# Patient Record
Sex: Male | Born: 1965 | Race: White | Hispanic: No | Smoking: Never smoker
Health system: Southern US, Community
[De-identification: ages and names within clinical notes are randomized; demographics above are authoritative.]

## PROBLEM LIST (undated history)

## (undated) DIAGNOSIS — I10 Essential (primary) hypertension: Secondary | ICD-10-CM

## (undated) DIAGNOSIS — F329 Major depressive disorder, single episode, unspecified: Secondary | ICD-10-CM

## (undated) DIAGNOSIS — L0390 Cellulitis, unspecified: Secondary | ICD-10-CM

## (undated) DIAGNOSIS — F32A Depression, unspecified: Secondary | ICD-10-CM

## (undated) DIAGNOSIS — G4730 Sleep apnea, unspecified: Secondary | ICD-10-CM

## (undated) DIAGNOSIS — R7303 Prediabetes: Secondary | ICD-10-CM

## (undated) DIAGNOSIS — M1990 Unspecified osteoarthritis, unspecified site: Secondary | ICD-10-CM

## (undated) HISTORY — PX: KNEE SURGERY: SHX244

## (undated) HISTORY — DX: Morbid (severe) obesity due to excess calories: E66.01

## (undated) HISTORY — DX: Essential (primary) hypertension: I10

## (undated) HISTORY — DX: Cellulitis, unspecified: L03.90

## (undated) HISTORY — DX: Unspecified osteoarthritis, unspecified site: M19.90

---

## 1997-11-05 ENCOUNTER — Encounter: Admission: RE | Admit: 1997-11-05 | Discharge: 1998-02-03 | Payer: Self-pay | Admitting: Internal Medicine

## 1997-12-04 ENCOUNTER — Ambulatory Visit (HOSPITAL_BASED_OUTPATIENT_CLINIC_OR_DEPARTMENT_OTHER): Admission: RE | Admit: 1997-12-04 | Discharge: 1997-12-04 | Payer: Self-pay | Admitting: Orthopedic Surgery

## 1999-02-09 ENCOUNTER — Ambulatory Visit (HOSPITAL_BASED_OUTPATIENT_CLINIC_OR_DEPARTMENT_OTHER): Admission: RE | Admit: 1999-02-09 | Discharge: 1999-02-09 | Payer: Self-pay | Admitting: Orthopedic Surgery

## 2000-01-08 ENCOUNTER — Emergency Department (HOSPITAL_COMMUNITY): Admission: EM | Admit: 2000-01-08 | Discharge: 2000-01-08 | Payer: Self-pay | Admitting: Emergency Medicine

## 2000-01-08 ENCOUNTER — Encounter: Payer: Self-pay | Admitting: Emergency Medicine

## 2002-10-22 ENCOUNTER — Emergency Department (HOSPITAL_COMMUNITY): Admission: EM | Admit: 2002-10-22 | Discharge: 2002-10-22 | Payer: Self-pay | Admitting: Emergency Medicine

## 2002-10-22 ENCOUNTER — Encounter: Payer: Self-pay | Admitting: Emergency Medicine

## 2003-01-22 ENCOUNTER — Encounter: Payer: Self-pay | Admitting: Orthopedic Surgery

## 2003-01-26 ENCOUNTER — Ambulatory Visit (HOSPITAL_COMMUNITY): Admission: RE | Admit: 2003-01-26 | Discharge: 2003-01-26 | Payer: Self-pay | Admitting: Orthopedic Surgery

## 2006-12-31 ENCOUNTER — Inpatient Hospital Stay (HOSPITAL_COMMUNITY): Admission: EM | Admit: 2006-12-31 | Discharge: 2007-01-03 | Payer: Self-pay | Admitting: Emergency Medicine

## 2007-01-01 ENCOUNTER — Encounter (INDEPENDENT_AMBULATORY_CARE_PROVIDER_SITE_OTHER): Payer: Self-pay | Admitting: Internal Medicine

## 2010-10-18 NOTE — H&P (Signed)
NAMEWILDER, KUROWSKI                 ACCOUNT NO.:  [id number]   MEDICAL RECORD NO.:  [id number]          PATIENT TYPE:  EMS   LOCATION:  ED                           FACILITY:  [facility]   PHYSICIAN:  Michaelyn Barter, M.D. DATE OF BIRTH:  06-10-1965   DATE OF ADMISSION:  12/30/2006  DATE OF DISCHARGE:                              HISTORY & PHYSICAL   PRIMARY CARE PHYSICIAN:  Unassigned.   CHIEF COMPLAINT:  Weakness and visual changes.   HISTORY OF PRESENT ILLNESS:  [name] is a 45 year old gentleman with  a past medical history of hypertension.  He states that for  approximately four weeks he has been taking Topamax to lose weight.  He  suffers from morbid obesity.  He indicates that approximately a week and  a half ago he developed a kidney infection.  These symptoms at that time  consisted of bilateral flank pain as well as pain on urination.  He was  seen by his primary care doctor and was started on ciprofloxacin.  His  back pain and dysuria have decreased.  However, over the last few days  he has developed a white-out type of sensation, during which time he  indicates that whenever he looks at sunlight or bright light or anything  that appears to be white in color or chrome, it disappears.  He  complains of fatigue and difficulty thinking straight.  He also stated  that his energy level has decreased.  Over the past four days, his  eating habits have decreased significantly and indicates that he stopped  eating food approximately four days ago and his p.o. intake for liquids  has also decreased to less than 1 L of fluid a day.  He stopped taking  his Topamax Thursday morning which is four days ago.  He also went on to  state that he has been taking a lot of ibuprofen every day for five  days.  Specifically he states that he takes at least four tablets of  ibuprofen a day.  The last time he took the ibuprofen was two weeks ago,  however.  He denies having any nausea, vomiting,  fevers or chills.  He  does indicate that he has been sleeping for longer periods than usual.  He complains of some upper abdominal discomfort that has been going on  for approximately one week and it feels like a muscle ache and it is  associated with certain positions.   PAST MEDICAL HISTORY:  1. UTI.  The patient indicates that he has had multiple urinary tract      infections in the past  2. Morbid obesity.  3. Hypertension.   PAST SURGICAL HISTORY:  Bilateral knee surgery.   ALLERGIES:  PERCOCET caused the patient's skin to crawl.   HOME MEDICATIONS:  1. Ciprofloxacin 500 mg p.o. b.i.d.  2. Atenolol 100 mg daily.  3. Lisinopril 20 mg daily.  4. Triamterine/hydrochlorothiazide one tablet daily.   SOCIAL HISTORY:  The patient works as an Retail banker.  Cigarettes:  Never.  Alcohol:  Occasional.  Cocaine:  Never.   FAMILY  HISTORY:  Mother has history of diabetes mellitus.  She died from  complications of peritoneal dialysis.  She suffered from renal failure  secondary to her diabetes.  Father heart defect at birth.  He also had a  history of rheumatic fever.   LABORATORY DATA:  The patient's bilirubin total was 0.8, bilirubin  direct less than 0.1, alk phos 45, SGOT 18, SGPT 23, total protein 526,  albumin 3.7.  Sodium 136, potassium 5.6, chloride 107, CO2 16, glucose  114, BUN 111, creatinine 6.07.  Calcium 3.5.  White blood cell count  7.9, hemoglobin 12.2, hematocrit 36.7, platelet count 492.   ASSESSMENT/PLAN:  1. Acute renal failure.  The etiology of this is questionable.  There      may be multiple contributing factors including the fact that the      patient is currently an ACE inhibitor, has been using ibuprofen      chronically.  Also has a decreased p.o. intake for both liquids and      fluids.  He also states that he has been on Topamax.  The      relationship of the patient's Topamax to his acute renal failure,      however, is questionable. Will hold the  patient's ACE inhibitors.      Will not provide any ibuprofen.  Will provide him with IV fluid      hydration for now and will also order a renal ultrasound.  In      addition, we will check a 24-hour urine creatinine level and      consider consultation with nephrology.  2. Hyperkalemia.  Will provide the patient with Kayexalate for now.  3. Metabolic acidosis.  This is most likely secondary to the patient's      renal failure.  Will consider adding bicarb.  4. History of urinary tract infection.  Will check a urinalysis.  5. Hypertension.  The patient's blood pressure currently is slightly      hypotensive.  This may be secondary to dehydration.  Will hold the      patient's antihypertensive medications for now, provide gentle IV      fluid hydration and monitor.  6. Morbid obesity.  Will monitor this.  7. History of obstructive sleep apnea.  Will provide CPAP machine.  8. Gastrointestinal prophylaxis.  Will provide Protonix.  9. Deep venous thrombosis prophylaxis.  Will provide Lovenox.      Michaelyn Barter, M.D.  Electronically Signed     OR/MEDQ  D:  12/31/2006  T:  12/31/2006  Job:  [id number]

## 2010-10-18 NOTE — Discharge Summary (Signed)
NAMEORVIS, Jason Berry                 ACCOUNT NO.:  [id number]   MEDICAL RECORD NO.:  [id number]          PATIENT TYPE:  INP   LOCATION:  1423                         [facility]ACILITY:  [facility]   PHYSICIAN:  Herbie Saxon, MDDATE OF BIRTH:  December 19, 1965   DATE OF ADMISSION:  12/30/2006  DATE OF DISCHARGE:  01/03/2007                               DISCHARGE SUMMARY   DISCHARGE DIAGNOSES:  1. Acute renal failure.  2. Morbid obesity.  3. Hypertension.  4. Urinary tract infection.  5. History of obstructive sleep apnea and uses CPAP at night.  6. Degenerative joint disease of knees.   HOSPITAL COURSE:  This 45 year old Caucasian male presented to the  emergency room complaining of weakness and reduced visual acuity.  The  patient had been having poor oral intake of food and liquids in the last  3-4 days, and he also gives a history of ibuprofen use.  On  presentation, his BUN was 111, creatinine was 6.0.  He had hyperkalemia.  The patient's __________  Topamax was stopped.  It was advised also to  avoid NSAIDs.  A renal consult was called with Dr. Terrial Rhodes.  His metabolic acidosis and hyperkalemia were conservatively treated with  IV fluids and sodium carbonate.  This has improved.  The radiology  ultrasound did show a large renal stone, but this was non-obstructive.  Renal failure has been gradually improving, and creatinine has been  trending down nicely.  The patient has no complaints.  He has been  started on statins for hyperlipidemia.   DISCHARGE CONDITION:  Stable.   DIET:  Low sodium, heart healthy, renal, 2 gm sodium with low  cholesterol.   ACTIVITY:  As tolerated.   FOLLOWUP:  He is follow up with his primary care physician, Dr.  __________  [facility] in a week who will arrange endocrinology,  urology and renal follow up.  He is to have his basic metabolic panel  repeated weekly at least for the next 2 weeks to monitor the creatinine,  and also to have LFT's  checked in about 4 weeks to monitor for hepatic  function, as he is on the statins.   MEDICATIONS ON DISCHARGE:  1. Atenolol 100 mg daily.  2. Lisinopril 10 mg daily.  3. Zocor 20 mg q.h.s.  4. HCTZ 12.5 mg daily.  5. Vicodin 5/500 q.6h. p.r.n.   RADIOLOGY:  Renal ultrasound from December 31, 2006 shows a 1.3 cm left  renal stone.  No evidence of hydronephrosis.   PHYSICAL EXAMINATION:  GENERAL:  On examination today, he is a young  man, not in acute distress, morbidly obese.  VITAL SIGNS:  Temperature is 98, pulse is 80, respiratory rate 20, blood  pressure 120/70.  HEENT:  Pupils equal, round and reactive to light and accommodation.  NECK:  Supple.  SKIN:  He is pale clinically.  Not jaundiced.  CHEST:  Clinically clear.  Sounds 1 and 2 only.  No murmurs.  ABDOMEN:  Benign.  NEUROLOGIC:  Alert and oriented x3.  EXTREMITIES:  Peripheral pulses.  No pedal edema.   LABORATORY DATA:  Lipid profile of December 31, 2006 shows total cholesterol  of 236, triglycerides 282, LDL 165, HDL of 15.  WBCs of 7, hematocrit  34, platelet count 417.  Sodium is 139, potassium 4.3, chloride 108,  bicarbonate 23, BUN 59, creatinine 2.0, glucose 98.   __________  to monitor the use of ACE inhibitor and this will be  discontinued if creatinine is not found to be trending up.  The patient  will continue his medication and discussed with the patient extensively.  He verbalizes understanding.   DISCHARGE TIME:  Greater than 30 minutes.      Herbie Saxon, MD  Electronically Signed     MIO/MEDQ  D:  01/03/2007  T:  01/03/2007  Job:  [id number]

## 2010-10-18 NOTE — Consult Note (Signed)
Jason Berry, Jason Berry                 ACCOUNT NO.:  [id number]   MEDICAL RECORD NO.:  [id number]          PATIENT TYPE:  INP   LOCATION:  1423                         FACILITY:  [facility]   PHYSICIAN:  Jason Berry, M.D.DATE OF BIRTH:  Jan 28, 1966   DATE OF CONSULTATION:  12/31/2006  DATE OF DISCHARGE:                                 CONSULTATION   RENAL CONSULTATION:   CONSULTING PHYSICIAN:  Jason Berry, M.D.   REASON FOR CONSULTATION:  Renal failure.   HISTORY OF PRESENT ILLNESS:  [name] is a 45 year old white male with  past medical history significant for morbid obesity, hypertension, and  obstructive sleep apnea, who presented to [facility]  with a 2-week history of malaise, fatigue, as well as some visual  disturbances in which he has seen a white-out due to bright light.  He  also reported a metallic taste in his mouth over the last 2 weeks.  He  gives a history of developing some bilateral flank pain as well as  dysuria 2 weeks ago and was treated with antibiotic Cipro for presumed  urinary tract infection right around the time his malaise began.  He  also reports that he has been taking nonsteroidals, about 800 mg of  ibuprofen daily every day for the last 5 years before he goes to work.  He also was started on Topamax approximately 1 month ago in order to  lose weight and has had a loss of appetite and has not been eating or  drinking much.  He also reports that he did develop some nausea and  vomiting about 2 weeks ago but resolved over the last week, but he was  still not able to eat or drink much because of a funny metallic taste in  his mouth.  Of note, he has been taking lisinopril for blood pressure is  well as Maxzide diuretic therapy.  Upon evaluation in the emergency  room, he was noted to have a BUN of 111, a creatinine of 6.07, and a  potassium of 5.6.  We were asked to further evaluate his hyperkalemic  renal failure.   ALLERGIES:   To PERCOCET, which causes skin crawling.   PAST MEDICAL HISTORY:  1. Hypertension.  2. Morbid obesity.  3. Obstructive sleep apnea.  4. History of urinary tract infections.  5. Degenerative joint disease of his knees, status post bilateral knee      surgeries.   HOME MEDICATIONS:  1. Cipro 500 mg b.i.d.  2. Atenolol 100 mg a day.  3. Lisinopril 20 mg a day.  4. Maxzide one a day   FAMILY HISTORY:  His father is alive in fair health.  He has rheumatic  heart disease.  His mother died from complications of end-stage renal  disease when she was on peritoneal dialysis, so he does have a history  of kidney disease.   SOCIAL HISTORY:  He is single.  He is an Retail banker.  Denies  tobacco.  Has occasional alcohol use.  No IV drug use.  Originally from  [place], [place].  REVIEW OF SYSTEMS:  As per HPI.  GENERAL:  He feels better today.  CARDIAC:  No chest pain, palpitations, orthopnea, PND.  PULMONARY:  No  shortness of breath, hemoptysis, productive cough.  GI:  No recurrence  of nausea, vomiting, hematochezia, melena, or bright red blood per  rectum.  GU:  No dysuria, pyuria, hematuria, urgency, frequency,  retention, but did note a decrease in his urine output prior to his  hospitalization.  RHEUMATOLOGIC:  He has chronic back pain as well as  knee pain.  No swollen, tender, red joints.  DERMATOLOGIC:  He denies  any rashes but has noted that he is sensitive to light UV light since  taking Topamax.  All other systems negative.   PHYSICAL EXAM:  This is a well-developed, obese man lying in bed in no  apparent distress.  Temperature 97.9, pulse 96, blood pressure 101/54, respiratory rate 28.  HEENT:  Normocephalic, atraumatic.  Pupils equally round and reactive to  light.  Extraocular movements intact.  Oropharynx without lesions.  NECK:  Supple.  No lymphadenopathy or bruits.  LUNGS:  Clear to auscultation and percussion bilaterally.  No rales or  rhonchi.  CARDIAC:   Regular rate and rhythm with faint heart sounds.  No  precordial rub appreciated.  ABDOMEN:  Obese, normoactive bowel sounds, soft, nontender.  EXTREMITIES:  He is without edema.   LABS:  His white blood cell count is 7.4, hemoglobin 12, platelets 417.  Sodium 136, potassium 5.4, chloride 108, CO2 18, BUN 105, creatinine  5.42, glucose 105, calcium 9.9.  Albumin 3.4.  Phosphorus 6.4.  Urinalysis showed trace blood, 30 protein, 0-2 white blood cells, 0-2  red blood cells.  Ultrasound showed no hydronephrosis or cortical  abnormality, but he does have a 1.3 cm stone in the left kidney.   ASSESSMENT/PLAN:  1. Renal failure.  This is presumably acute, although we did not have      any previous labs available for review at this time.  Differential      diagnosis includes acute renal failure secondary to Topamax; acute      interstitial nephritis from Cipro, although this is less likely      since he does not have fevers or rash; acute tubular necrosis due      to relative hypotension in the setting of ACE inhibitors and      nonsteroidals as well as nausea, vomiting and decreased p.o.      intake.  Also in the differential is rhabdomyolysis, obstruction,      and also an acute glomerulonephritis.  In the meantime, his BUN and      creatinine are already improving.  We will continue with IV fluids,      continue to hold his ACE inhibitors and nonsteroidals.  Agree with      stopping Cipro, Topamax and diuretics.  The ultrasound did not show      any evidence of obstruction but he does have a left renal stone.      In the meantime will check fractional excretion of sodium as well      as check for urine eosinophils and CPK levels to rule out      rhabdomyolysis.  We will continue to follow his urine output daily,      serum creatinine.  2. Hyperkalemia secondary to #1.  Continue to follow.  3. Metabolic acidosis secondary to renal failure.  Change his IV      fluids to include sodium  bicarbonate and follow his CO2.  4. Hypotension.  This is of unclear etiology.  Will check blood and      urine cultures and continue to hold his blood pressure medicines      and follow.  5. Nephrolithiasis.  This is a large stone.  Would consider urology to      evaluate as a possible source of his urinary      tract infection and possibly urosepsis and possible need for a      stent.  6. Obstructive sleep apnea.  Will continue his CPAP and follow.   Thank you for this consultation.  We will continue to follow along with  you.           ______________________________  Jason Berry, M.D.     JC/MEDQ  D:  12/31/2006  T:  01/01/2007  Job:  [id number]

## 2010-10-21 NOTE — Op Note (Signed)
NAME:  Jason Berry, Jason Berry                           ACCOUNT NO.:  [id number]   MEDICAL RECORD NO.:  [id number]                   PATIENT TYPE:  OIB   LOCATION:  NA                                   FACILITY:  [facility]   PHYSICIAN:  Feliberto Gottron. Turner Daniels, M.D.                DATE OF BIRTH:  1965/06/12   DATE OF PROCEDURE:  01/26/2003  DATE OF DISCHARGE:                                 OPERATIVE REPORT   PREOPERATIVE DIAGNOSIS:  Right knee medial meniscal tear.   POSTOPERATIVE DIAGNOSIS:  Right knee chondromalacia patella grade 3.   OPERATION PERFORMED:  Right knee arthroscopic debridement of chondromalacia,  apex of patella, grade 3.   SURGEON:  Feliberto Gottron. Turner Daniels, M.D.   ASSISTANTLaural Benes. Jannet Mantis.   ANESTHESIA:  Local with IV sedation.   ESTIMATED BLOOD LOSS:  Minimal.   FLUIDS REPLACED:  crystalloids.   DRAINS:  None.   TOURNIQUET TIME:  None.   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  MRI was  consistent with a medial meniscal tear and because of persistent pain,  catching and popping along the medial side of the knee he is taken for  arthroscopic evaluation and treatment of his right knee.  He had similar  problems with his left knee.  He has failed conservative treatment with anti-  inflammatory medicines, exercises and observation and is having a great deal  of difficulty even doing light work at this point.   DESCRIPTION OF PROCEDURE:  The patient was identified by arm band and take  to the operating room at [facility] main hospital where he was taken because of  his body habitus at 425 pounds and some airway difficulty and local  anesthesia was induced into the right knee.  Lateral post applied to the  table and right lower extremity prepped and draped in the usual sterile  fashion from the ankle to the midthigh.  Using a #11 blade, standard  inferomedial and inferolateral  peripatellar portals were then made allowing  introduction of the arthroscope through the inferolateral portal and a 3.5  great white sucker shaver through the inferomedial portal.  We immediately  identified grade 3 chondromalacia with flap tears of the patella over a 1 cm  squared area and this was debrided back to stable margins with a 3.5 Gator  sucker shaver.  The trochlea, medial and lateral compartments were actually  in excellent condition.  There was no meniscal tearing medially or  laterally.  The cruciate ligaments were intact.  The cartilage was pristine.  The gutters were cleared, medially and laterally.  We also explored the  posterior aspect of the compartments taking the scope medial and lateral to  the PCL.  The knee was then washed out with normal saline solution.  The  arthroscopic  instruments removed.  A dressing of Xeroform, 4 x 4 dressing  sponges, Webril and Ace wrap was applied.  The patient was awakened and  taken to the recovery room without difficulty.                                                Feliberto Gottron. Turner Daniels, M.D.    Ovid Curd  D:  01/26/2003  T:  01/26/2003  Job:  [id number]

## 2011-03-20 LAB — BASIC METABOLIC PANEL
BUN: 111 — ABNORMAL HIGH
CO2: 16 — ABNORMAL LOW
CO2: 25
Calcium: 10.3
Calcium: 10.5
Chloride: 107
Creatinine, Ser: 6.07 — ABNORMAL HIGH
GFR calc Af Amer: 13 — ABNORMAL LOW
GFR calc Af Amer: >60
GFR calc non Af Amer: 10 — ABNORMAL LOW
Glucose, Bld: 114 — ABNORMAL HIGH
Potassium: 5.6 — ABNORMAL HIGH
Sodium: 136
Sodium: 140

## 2011-03-20 LAB — DIFFERENTIAL
Eosinophils Relative: 1
Lymphocytes Relative: 25
Lymphs Abs: 2
Monocytes Absolute: 0.7
Monocytes Relative: 9
Neutro Abs: 5.1

## 2011-03-20 LAB — LIPID PANEL
Cholesterol: 236 — ABNORMAL HIGH
HDL: 15 — ABNORMAL LOW
Triglycerides: 282 — ABNORMAL HIGH

## 2011-03-20 LAB — CBC
HCT: 31.4 — ABNORMAL LOW
HCT: 36.7 — ABNORMAL LOW
Hemoglobin: 12.2 — ABNORMAL LOW
MCHC: 33.3
MCV: 86.2
MCV: 86.3
MCV: 86.8
Platelets: 492 — ABNORMAL HIGH
RBC: 3.61 — ABNORMAL LOW
RBC: 3.99 — ABNORMAL LOW
RBC: 4.25
RDW: 12.9
WBC: 6.6
WBC: 7.4
WBC: 7.9

## 2011-03-20 LAB — RENAL FUNCTION PANEL
BUN: 59 — ABNORMAL HIGH
BUN: 87 — ABNORMAL HIGH
CO2: 19
Calcium: 10
Chloride: 108
Chloride: 110
Glucose, Bld: 105 — ABNORMAL HIGH
Glucose, Bld: 91
Glucose, Bld: 98
Phosphorus: 3.9
Phosphorus: 6.4 — ABNORMAL HIGH
Potassium: 4.3
Potassium: 4.8
Potassium: 5.4 — ABNORMAL HIGH
Sodium: 136

## 2011-03-20 LAB — HEPATIC FUNCTION PANEL
ALT: 26
AST: 18
Alkaline Phosphatase: 45
Bilirubin, Direct: <0.1
Total Bilirubin: 0.8

## 2011-03-20 LAB — URINE CULTURE: Colony Count: 80000

## 2011-03-20 LAB — URINALYSIS, ROUTINE W REFLEX MICROSCOPIC
Bilirubin Urine: NEGATIVE
Ketones, ur: NEGATIVE
Protein, ur: 30 — AB
Specific Gravity, Urine: 1.015
Urobilinogen, UA: 0.2

## 2011-03-20 LAB — PROTIME-INR: Prothrombin Time: 13.5

## 2011-03-20 LAB — SODIUM, URINE, RANDOM: Sodium, Ur: 78

## 2011-03-20 LAB — URINE MICROSCOPIC-ADD ON

## 2011-03-20 LAB — HEMOGLOBIN A1C: Hgb A1c MFr Bld: 6

## 2011-03-20 LAB — B-NATRIURETIC PEPTIDE (CONVERTED LAB): Pro B Natriuretic peptide (BNP): <30

## 2011-03-22 ENCOUNTER — Ambulatory Visit (INDEPENDENT_AMBULATORY_CARE_PROVIDER_SITE_OTHER): Payer: PRIVATE HEALTH INSURANCE | Admitting: Surgery

## 2011-03-22 ENCOUNTER — Encounter (INDEPENDENT_AMBULATORY_CARE_PROVIDER_SITE_OTHER): Payer: Self-pay | Admitting: Surgery

## 2011-03-22 VITALS — BP 156/84 | HR 92 | Temp 97.2°F | Resp 24 | Ht 72.0 in | Wt >= 6400 oz

## 2011-03-22 DIAGNOSIS — E669 Obesity, unspecified: Secondary | ICD-10-CM

## 2011-03-22 NOTE — Progress Notes (Signed)
Minor Iden comes in with his father to discuss weight loss surgery. He's been to [facility] and has been very frustrated by things. His weight is 592 his BMI is 80. He is a broad chested man and I think his BMI might not fill the whole story. He is 6 feet tall. I told them that to get into a BMI the mid 50s which required lose down to the low 400 pounds. Initially the discouraged him and his father been explained to reason we need him to lose visceral weight and abdominal wall weight.    I want to work with him and I am going to get Okey Regal to set up a point with [facility] and with psychology. I will see him back in about 6 weeks to assess his progress. At some point we will decide whether he would be a candidate for a gastric bypass or a band.

## 2011-03-23 ENCOUNTER — Other Ambulatory Visit (INDEPENDENT_AMBULATORY_CARE_PROVIDER_SITE_OTHER): Payer: Self-pay | Admitting: General Surgery

## 2011-04-19 ENCOUNTER — Encounter: Payer: Self-pay | Admitting: *Deleted

## 2011-04-19 ENCOUNTER — Encounter: Payer: PRIVATE HEALTH INSURANCE | Attending: Surgery | Admitting: *Deleted

## 2011-04-19 DIAGNOSIS — Z713 Dietary counseling and surveillance: Secondary | ICD-10-CM | POA: Insufficient documentation

## 2011-04-19 NOTE — Progress Notes (Signed)
  Medical Nutrition Therapy:  Appt start time: 1500 end time:  1600.   Assessment:  Primary concerns today: weight management. Mr. Sakai hopes to have bariatric surgery with Dr. Daphine Deutscher upon losing weight down to around a mid-50's BMI. He notes that he has had extreme frustration with the bariatric surgery process and other surgery centers. He reports that he does like Dr. Daphine Deutscher and our team so far and seems very motivated and energetic to lose weight. He has lost ~10 lbs since seeing Dr. Daphine Deutscher last (BMI = 79). He stated that "diets do not work" for him and the only plan that gives and success is the [place]'s diet. Per food recall pt consuming excessive amounts of fat, calories, and protein. I do recommend a lower-carbohydrate diet yet we will need to modify portions sizes and change to leaner meats with lighter dressings/sauces. Mr. Corniel regularly skips meals or goes >7 hrs without eating which causes him to "binge" at the next meal. Will recommend smaller, more frequent meals and a Pre-Op Diet type meal plan which includes protein supplements. This patient is very friendly, energetic and seems like he will be compliant with nutrition recommendations.  MEDICATIONS: See medication list   DIETARY INTAKE:  Usual eating pattern includes 3 meals and 2 snacks per day.  24-hr recall:  B (AM): 3-4 eggs, 3-4 [place] patties  Snk (AM): olive (2), 1 oz cheese  L (PM): Hamburger patty (2 - 16 oz total meat), broccoli (w/ butter or alfredo sauce), pickle Snk (PM): N/A D (PM): Salad (3-5 cups), cucumbers, cheese (1/2 cup), chicken (8-16 oz), bleu cheese dressing (1/4 - 1/3 cup dressing) Snk ( PM): N/A Beverages: unsweetened tea, diet soda, water - 150 oz total  Estimated energy intake per food recall analysis: 3800 calories 100 g carbohydrates 200 g + protein 200 g + fat  Usual physical activity: Mountain biking (2-3 times/week), 25 minutes/week  Progress Towards Goal(s):  In progress.   Nutritional  Diagnosis:  NI-1.5 Excessive energy intake As related to large portions of food.  As evidenced by pt consuming >3000 kcals/day.    Intervention:  Nutrition education.  Handouts given during visit include:  Bariatric Surgery Pre-Op Diet  Protein Shake Handout  Monitoring/Evaluation:  Dietary intake, exercise, and body weight in 2 week(s).

## 2011-04-19 NOTE — Patient Instructions (Signed)
Follow:    Pre-Op Diet for weight loss before bariatric surgery  May consume 6-8 oz lean protein  Increase non-starchy vegetables  Only use light or fat-free dressings  Only use light margarine spreads  Avoid thick/creamy sauces on vegetables  Use protein supplements between meals  Continue drinking >64 oz fluid  Continue exercise on mountain bike as able  Follow-up at [facility] in 2 weeks for weight check.

## 2011-05-10 ENCOUNTER — Encounter: Payer: PRIVATE HEALTH INSURANCE | Attending: Surgery | Admitting: *Deleted

## 2011-05-10 ENCOUNTER — Encounter: Payer: Self-pay | Admitting: *Deleted

## 2011-05-10 DIAGNOSIS — Z713 Dietary counseling and surveillance: Secondary | ICD-10-CM | POA: Insufficient documentation

## 2011-05-10 NOTE — Progress Notes (Signed)
  Medical Nutrition Therapy: Appt start time: 1500 end time: 1600.   Assessment: Primary concerns today: weight management. Jason Berry hopes to have bariatric surgery with Dr. Daphine Deutscher upon losing weight down to around a mid-50's BMI. Today he weighs in 1.5lb lighter than last month. He feels that his lack of significant weight loss may be related to binge eating on Thanksgiving. He's here today to get back on track.  MEDICATIONS: See medication list   DIETARY INTAKE:  Usual eating pattern includes 3 meals and 2 snacks per day.  24-hr recall:  B (AM): 3 eggs, 2 [place] sausage patties, 4 oz cheese Snk (AM): Protein Shake L (PM): Chicken breast (10-14 oz total meat), broccoli (fresh) Snk (PM): 3 Protein Bars D (PM): Salad (3-5 cups), cucumbers, cheese (1/2 cup), chicken (6 oz), oil and vinegar dressing (1/4 - 1/3 cup dressing)  Snk ( PM): 3 Protein Bars *Or skips 1 meal, 2 snacks per day and overeats when he does eat  Beverages: unsweetened tea, diet soda, water - 150 oz total   Estimated energy intake per food recall analysis:  3000 calories  100 g carbohydrates  200 g + protein  200 g + fat   Usual physical activity: No physical activity reported over the past month  Progress Towards Goal(s): In progress.   Nutritional Diagnosis:  NI-1.5 Excessive energy intake As related to large portions of food. As evidenced by pt consuming >3000 kcals/day.   Intervention: Nutrition education.   Handouts given during visit include:  Bariatric Surgery Pre-Op Diet  Protein Shake Handout  Monitoring/Evaluation: Dietary intake, exercise, and body weight in 2 week(s).

## 2011-05-10 NOTE — Patient Instructions (Addendum)
Follow:  Restart Pre-Op Diet for weight loss before bariatric surgery  Avoid meal skipping; Eat 3-4 smaller meals/day May consume 6-8 oz lean protein (smaller portions)  Increase non-starchy vegetables  Only use light or fat-free dressings  Only use Pam cooking spray, olive oil, or canola oil for cooking  Use protein supplements between meals  Avoid Protein Bars due to high calories/binging on these foods Continue drinking >64 oz fluid  Continue being active as you can

## 2011-06-13 ENCOUNTER — Ambulatory Visit: Payer: Self-pay | Admitting: *Deleted

## 2011-06-26 ENCOUNTER — Encounter: Payer: Self-pay | Admitting: *Deleted

## 2011-06-26 ENCOUNTER — Encounter: Payer: PRIVATE HEALTH INSURANCE | Attending: Surgery | Admitting: *Deleted

## 2011-06-26 DIAGNOSIS — E663 Overweight: Secondary | ICD-10-CM | POA: Insufficient documentation

## 2011-06-26 DIAGNOSIS — Z713 Dietary counseling and surveillance: Secondary | ICD-10-CM | POA: Insufficient documentation

## 2011-06-26 NOTE — Progress Notes (Signed)
  Medical Nutrition Therapy: Appt start time: 1500 end time: 1600.   Assessment: Primary concerns today: weight management. Jason Berry arrives 20 minutes late for his 30 minute appointment at 12pm and is required to reschedule to 4pm later the same day. He was very upset by this and stormed out of the office. Upon follow up at 4pm he reports that he has been under a great deal of stress due to the holidays and problems with his father. He is up 8 lbs since his last visit which he contributes to poor food label reading. His portions remain considerably larger than his goal portions and feels if sticks to an "Atkin's type" diet he will lose the weight regardless of his portions. Reviewed portion sizes, food labels, sauces/gravies/oils in cooking and reiterated goals for weight loss.  MEDICATIONS: See medication list   DIETARY INTAKE:  Usual eating pattern includes 3 meals and 2 snacks per day.   24-hr recall:  B (AM): 3 eggs, 2 [place] sausage patties, 4 oz cheese  Snk (AM): Protein Shake  L (PM): Chicken breast (10-14 oz total meat), broccoli (fresh)  Snk (PM): 3 Protein Bars  D (PM): Salad (3-5 cups), cucumbers, cheese (1/2 cup), chicken (6 oz), oil and vinegar dressing (1/4 - 1/3 cup dressing)  Snk ( PM): 3 Protein Bars  *Or skips 1 meal, 2 snacks per day and overeats when he does eat   Beverages: unsweetened tea, diet soda, water - 150 oz total   Estimated energy intake per food recall analysis:  3000-3500 calories  300 g carbohydrates  200 g + protein  200 g + fat   Usual physical activity: No physical activity reported over the past month   Progress Towards Goal(s): In progress.   Nutritional Diagnosis:  [place]-1.5 Excessive energy intake As related to large portions of food. As evidenced by pt consuming >3000 kcals/day.   Intervention: Nutrition education.  Handouts given during visit include:  Bariatric Surgery Pre-Op Diet  Label reading  Monitoring/Evaluation: Dietary intake,  exercise, and body weight in 2 week(s).

## 2011-06-26 NOTE — Patient Instructions (Signed)
Follow:  Restart Pre-Op Diet for weight loss before bariatric surgery  Avoid meal skipping; Eat 3-4 smaller meals/day May consume 6-8 oz lean protein (smaller portions)  Increase non-starchy vegetables  Only use light or fat-free dressings  Only use Pam cooking spray, olive oil, or canola oil for cooking  Use protein supplements between meals  Avoid Protein Bars due to high calories/binging on these foods Continue drinking >64 oz fluid  Continue being active as you can 

## 2011-07-27 ENCOUNTER — Encounter: Payer: Self-pay | Admitting: *Deleted

## 2011-07-27 ENCOUNTER — Encounter: Payer: PRIVATE HEALTH INSURANCE | Attending: Surgery | Admitting: *Deleted

## 2011-07-27 DIAGNOSIS — Z713 Dietary counseling and surveillance: Secondary | ICD-10-CM | POA: Insufficient documentation

## 2011-07-27 NOTE — Patient Instructions (Signed)
Follow:  Restart Pre-Op Diet for weight loss before bariatric surgery  Slow down eating at meals. Avoid meal skipping; Eat 3-4 smaller meals/day  Consume 6-8 oz lean protein (smaller portions)  Try light or fat-free dressings - Try the spray dressings.  Only use Pam cooking spray, olive oil, or canola oil for cooking  Use protein supplements between meals.  Continue drinking >64 oz fluid.  Continue being active as you can.

## 2011-07-27 NOTE — Progress Notes (Signed)
  Medical Nutrition Therapy: Appt start time: 1500 end time: 1600.   Assessment: Primary concerns today: weight management; F/U.  Mr. Klem returns for F/U with a wt loss of 17.7 lbs. Continues to follow moderately strict Atkins Diet with very few CHO and highly excessive protein intake (~42% of calories). Advised pt to decrease protein intake at lunch and dinner to approximately half current portions. Excessive sodium intake also noted.  Reviewed portion sizes, hidden sodium, excessive pro intake, and advised pt not to keep CHO out of meals all together.  MEDICATIONS: See medication list.  DIETARY INTAKE:  Usual eating pattern includes 3 meals and 2 snacks per day.   24-hr recall: (~2300 cal, 240g pro, 119g fat) B (AM): 3 eggs, 2 [place] sausage patties, 4 oz cheese Snk (AM): few pcs of smoked ham L (PM): Chicken breast (10-16 oz total meat), broccoli (fresh) or grilled asparagus Snk (PM):  Pork rinds, cheese (2% - 1 pc)  D (PM): Chopped steak (16 oz), onions, mushrooms, grilled zucchini Snk ( PM):  SF vanilla jello w/ SF whipped cream Beverages: unsweetened tea, diet soda (rare), water - 150 oz total   Usual physical activity:  Walks new dog 3-4 times/day.   Progress Towards Goal(s):  In progress.   Nutritional Diagnosis:  NI-1.5 Excessive energy intake related to large portions of food as evidenced by pt consuming >2300 kcals/day while attempting weight loss.   Intervention: Nutrition education.   Monitoring/Evaluation: Dietary intake, exercise, and body weight in 4 week(s).

## 2011-07-28 ENCOUNTER — Encounter: Payer: Self-pay | Admitting: *Deleted

## 2011-08-24 ENCOUNTER — Encounter: Payer: PRIVATE HEALTH INSURANCE | Attending: Surgery | Admitting: *Deleted

## 2011-08-24 ENCOUNTER — Encounter: Payer: Self-pay | Admitting: *Deleted

## 2011-08-24 DIAGNOSIS — Z713 Dietary counseling and surveillance: Secondary | ICD-10-CM | POA: Insufficient documentation

## 2011-08-24 NOTE — Patient Instructions (Signed)
Continue previous goals:  Restart Pre-Op Diet for weight loss before bariatric surgery  Slow down eating at meals. Avoid meal skipping; Eat 3-4 smaller meals/day  Consume 6-8 oz lean protein (smaller portions)  Try light or fat-free dressings - Try the spray dressings.  Only use Pam cooking spray, olive oil, or canola oil for cooking  Use protein supplements between meals.  Continue drinking >64 oz fluid.  Continue being active as you can.

## 2011-08-24 NOTE — Progress Notes (Signed)
Medical Nutrition Therapy: Appt start time: 1100 end time: 1130.   Assessment: Primary concerns today: weight management; F/U.  Mr. Rossa returns for F/U with a wt loss of 13.6 lbs. Reports following the Atkins diet and total weight loss equals 31.3 lbs in last 2 months.  Advised pt to choose leaner protein options and reminded him not to avoid CHO at meals. Concerned about MD wanting to add Metformin. States his BGs are 116-120 mg and recent A1c at [facility] was "5-something". Will call office to verify.  MEDICATIONS: See medication list.  Reports MD at hospital in [place] wants to add Metformin. Pt fears this will make his pancreas dependent on the medication and give him full blown DM.   DIETARY INTAKE:  Usual eating pattern includes 3 meals and 2 snacks per day. Pt is following Atkins diet. Reports attempts to decrease high fat proteins (sausage, reg beef) and replace with lean protein sources (Atkins shakes, [place] sausage, ground [place]).    Usual physical activity:  Walks new dog 3-4 times/day.   Progress Towards Goal(s):  In progress.   Nutritional Diagnosis:  [place]-3.3 Morbid obesity related to excessive CHO and fat intake as evidenced by patient reported food history and sedentary lifestyle.  Intervention: Nutrition education.   Monitoring/Evaluation: Dietary intake, exercise, and body weight in 8 week(s).

## 2011-09-14 ENCOUNTER — Telehealth: Payer: Self-pay | Admitting: *Deleted

## 2011-09-17 NOTE — Telephone Encounter (Signed)
Returned pt call regarding whether he should take Metformin or not. Redirected to MD.

## 2011-10-17 ENCOUNTER — Ambulatory Visit: Payer: Self-pay | Admitting: *Deleted

## 2011-10-18 ENCOUNTER — Ambulatory Visit: Payer: Self-pay | Admitting: *Deleted

## 2011-10-19 ENCOUNTER — Ambulatory Visit: Payer: Self-pay | Admitting: *Deleted

## 2012-01-18 ENCOUNTER — Encounter: Payer: Self-pay | Admitting: *Deleted

## 2012-01-18 ENCOUNTER — Encounter: Payer: PRIVATE HEALTH INSURANCE | Attending: Surgery | Admitting: *Deleted

## 2012-01-18 DIAGNOSIS — Z01818 Encounter for other preprocedural examination: Secondary | ICD-10-CM | POA: Insufficient documentation

## 2012-01-18 DIAGNOSIS — Z713 Dietary counseling and surveillance: Secondary | ICD-10-CM | POA: Insufficient documentation

## 2012-01-18 NOTE — Patient Instructions (Addendum)
Continue previous goals:  Restart Pre-Op Diet for 1 week Start Modified Bariatric Surgery Post-Op diet for 1-2 weeks Adjust to include 30g CHO Avoid meal skipping; Eat 3-4 smaller meals/day  Consume 6-8 oz lean protein (smaller portions)  Continue being active as you can.

## 2012-01-18 NOTE — Progress Notes (Addendum)
Medical Nutrition Therapy: Appt start time: 1145 end time: 1220.   Primary concerns today:  Pre-Op Bariatric Surgery Weight management; F/U.  Jason Berry returns today for f/u and reports he is at a standstill with weight loss. Food recall shows he has continued to eat high fat meats, but has decreased his portions. He reports having more energy, that he can walk farther before tiring, and that his clothes are fitting much looser now. States his MD is considering a liquid diet at this point to jumpstart his weight loss. Discussed and created a plan of action.    MEDICATIONS: Reports he is now taking Metformin. Not taking allopurinol at this time. Plans to resume.  DIETARY INTAKE:  Usual eating pattern includes 3 meals and 2 snacks per day. B: 4 eggs w/ cheese, 2 pcs regular sausage, hot tea w/ splenda Snk: SF Jello L: 6-8 oz ground beef (85/15) w/ 1.5 pcs regular bacon Snk: Pork rinds D: 8 oz steak (top round), 2-3 oz sweet potato, salad  Usual physical activity:  Walks dog 3-4 times/day; active around garage fixing cars   Progress Towards Goal(s):  In progress.   Nutritional Diagnosis:  [place]-3.3 Morbid obesity related to excessive CHO and fat intake as evidenced by patient reported food history and sedentary lifestyle.  Intervention: Nutrition education.   Monitoring/Evaluation: Dietary intake, exercise, and body weight in 8 week(s).

## 2012-01-19 ENCOUNTER — Ambulatory Visit: Payer: Self-pay | Admitting: *Deleted

## 2012-02-20 ENCOUNTER — Encounter: Payer: Self-pay | Admitting: *Deleted

## 2012-02-20 ENCOUNTER — Encounter: Payer: PRIVATE HEALTH INSURANCE | Attending: Surgery | Admitting: *Deleted

## 2012-02-20 VITALS — Ht 72.0 in | Wt >= 6400 oz

## 2012-02-20 DIAGNOSIS — Z01818 Encounter for other preprocedural examination: Secondary | ICD-10-CM | POA: Insufficient documentation

## 2012-02-20 DIAGNOSIS — Z713 Dietary counseling and surveillance: Secondary | ICD-10-CM | POA: Insufficient documentation

## 2012-02-20 NOTE — Patient Instructions (Addendum)
   Continue previous goals  Increase exercise as able.

## 2012-02-20 NOTE — Progress Notes (Addendum)
Medical Nutrition Therapy: Appt start time: 1000 end time: 1100.   Primary concerns today: Nutrition Modifications for Bariatric Surgery Candidate; F/U.  Cindee Lame returns today with his significant other who had the gastric sleeve several years ago. She has been helping him at home and cooking for him. He reports great strides in changing his eating behavior and now finds himself full/pushing the plate away after much smaller portions. He has slowed his pace at meals and reports having even more energy than last visit. He is visibly smaller today.  He feels he is "on a roll" and plans to continue the specialized post-op diet, as he contributes his recent success to this plan.  BMI is now down from 80 to 73 kg/m^2.   MEDICATIONS: No changes reported.  Usual physical activity:  Active around garage fixing cars; increasing ADLs and noticing it is becoming easier to stand for longer periods of time.  Progress Towards Goal(s):  In progress.   Nutritional Diagnosis:  [place]-3.3 Morbid obesity related to excessive CHO and fat intake as evidenced by patient reported food history and sedentary lifestyle.  Intervention: Nutrition education.   Monitoring/Evaluation: Dietary intake, exercise, and body weight in 4 week(s).

## 2012-03-19 ENCOUNTER — Encounter: Payer: PRIVATE HEALTH INSURANCE | Attending: Surgery | Admitting: *Deleted

## 2012-03-19 ENCOUNTER — Encounter: Payer: Self-pay | Admitting: *Deleted

## 2012-03-19 VITALS — Ht 72.0 in | Wt >= 6400 oz

## 2012-03-19 DIAGNOSIS — Z01818 Encounter for other preprocedural examination: Secondary | ICD-10-CM | POA: Insufficient documentation

## 2012-03-19 DIAGNOSIS — Z713 Dietary counseling and surveillance: Secondary | ICD-10-CM | POA: Insufficient documentation

## 2012-03-19 NOTE — Progress Notes (Addendum)
Medical Nutrition Therapy: Appt start time: 1030  End time: 1115.   Primary concerns today: Nutrition Modifications for Bariatric Surgery Candidate; F/U.  Cindee Lame comes in today discouraged with lack of weight loss. Reports extreme stress recently with family issues and increased emotional eating after last MD visit showed no wt loss. Has not been taking meds for last 3 days; some weight gain likely d/t fluid retention. Wants to try "Atkins Cheat Night" diet, though told him to hold off for now. Discovered he has been consuming a 14 oz aerosol can of heavy whipping cream a day, which has 1590 cal and 159 g of fat (80 g as sat fat).  States he has been doing this for a long time. This is likely the culprit in his lack of significant weight loss. Will see him back in 4 weeks to determine.  MEDICATIONS: Hasn't taken any meds (except Zoloft) in last 3 days d/t stress and forgetting to take them.   Usual physical activity:  Decreased recently.  Progress Towards Goal(s):  In progress.   Nutritional Diagnosis:  [place]-3.3 Morbid obesity related to excessive calorie and fat intake as evidenced by patient reported food history and sedentary lifestyle.  Intervention: Nutrition education.   Goals:   Stop the whipping cream and replace with greek yogurt.   Go back to old eating pattern  Increase exercise as able   Monitoring/Evaluation: Dietary intake, exercise, and body weight in 4 week(s).

## 2012-03-19 NOTE — Patient Instructions (Addendum)
Goals:   Stop the whipping cream and replace with greek yogurt.   Go back to old eating pattern  Increase exercise as able

## 2012-04-19 ENCOUNTER — Encounter: Payer: Self-pay | Admitting: *Deleted

## 2012-04-19 ENCOUNTER — Encounter: Payer: PRIVATE HEALTH INSURANCE | Attending: Surgery | Admitting: *Deleted

## 2012-04-19 VITALS — Ht 71.5 in | Wt >= 6400 oz

## 2012-04-19 DIAGNOSIS — Z01818 Encounter for other preprocedural examination: Secondary | ICD-10-CM | POA: Insufficient documentation

## 2012-04-19 DIAGNOSIS — Z713 Dietary counseling and surveillance: Secondary | ICD-10-CM | POA: Insufficient documentation

## 2012-04-19 NOTE — Progress Notes (Signed)
Medical Nutrition Therapy: Appt start time: 1030  End time: 1115.   Primary concerns today: Nutrition Modifications for Bariatric Surgery Candidate; F/U.  Jason Berry returns today for monthly f/u and his discouragement continues over lack of significant wt loss.  Reports he has not had ANY whipping cream since last visit, though has only lost 5 lbs since last visit. Consumes large portions of lean protein and non-starchy vegetables. Has been doing his "Atkins Cheat Nights" from 6p-12a 1 night a week; recall shows highly excessive portions of CHO during this time. Still not taking any meds and reports recent shoulder injury, which he was seen in the ED for. Has caused decrease in the little exercise he was getting. Has not seen Dr. Daphine Deutscher in over a year. Recommend f/u with him.  TANITA  BODY COMP RESULTS  04/19/12   Weight (lbs) 549.0   BMI (kg/m^2) 75.5   Fat Mass (lbs) 326.0   Fat Free Mass (lbs) 223.0   Total Body Water (lbs) 163.0   MEDICATIONS: Hasn't taken any meds in last few weeks; states he ran out of them.   Recent Dietary Recall:  B: 10 oz Whey protein shake w/ almond milk OR 2 eggs w/ egg whites, 2% cheese and 2 pcs [place] sausage S: 2-3 oz pork rinds (sometimes w/ yogurt dip) L: 10 oz protein shake OR 2 hot dogs w/ buns, 6-8 oz green beans S: Cabbage, Atkins bar, OR pork rinds  D: Grilled chicken breast (8-10 oz), broccoli or cabbage S: SF jello Beverages: decaf crystal light tea, water  Atkins Cheat Nights include chinese food (1/2 of portion he used to get), 1 quart of lasagna, pasta/rice. Sometimes is extremely hungry all day, then others he is fine with the recall portions.  Usual physical activity:  None d/t recent shoulder injury.   Progress Towards Goal(s):  In progress.   Nutritional Diagnosis:  [place]-3.3 Morbid obesity related to excessive calorie and fat intake as evidenced by patient reported food history and sedentary lifestyle.  Intervention: Nutrition education.    Goals:   Discontinue Atkins Cheat Nights  Increase exercise as able - recommend f/u with MD regarding shoulder injury.  Monitoring/Evaluation: Dietary intake, exercise, and body weight in 4 week(s).

## 2012-05-17 ENCOUNTER — Ambulatory Visit: Payer: Self-pay | Admitting: *Deleted

## 2012-05-21 ENCOUNTER — Encounter: Payer: Self-pay | Admitting: *Deleted

## 2012-05-21 ENCOUNTER — Encounter: Payer: PRIVATE HEALTH INSURANCE | Attending: Surgery | Admitting: *Deleted

## 2012-05-21 VITALS — Ht 71.5 in | Wt >= 6400 oz

## 2012-05-21 DIAGNOSIS — Z713 Dietary counseling and surveillance: Secondary | ICD-10-CM | POA: Insufficient documentation

## 2012-05-21 DIAGNOSIS — Z01818 Encounter for other preprocedural examination: Secondary | ICD-10-CM | POA: Insufficient documentation

## 2012-05-21 NOTE — Progress Notes (Signed)
Medical Nutrition Therapy: Appt start time: 1030  End time: 1130.   Primary concerns today: Nutrition Modifications for Bariatric Surgery Candidate; F/U.  Jason Berry returns for f/u with a 5 lb weight loss from last visit (and 7 lbs of FAT MASS!!), even after overeating at Thanksgiving.  Reports he is no longer eating butter, pork rinds, or doing the Atkins "Cheat Nights".  Continues smaller, healthier portions with help from Myrene Buddy, his live-in girlfriend who had bariatric surgery several years ago. Great support for him!!  He is very encouraged by the [place] data and finally "gets it" that he cannot go strictly by the scale.  Plans to start riding his bike for increased exercise.   TANITA  BODY COMP RESULTS  04/19/12 05/21/12   Weight (lbs) 549.0 544.0   BMI (kg/m^2) 75.5 74.8   Fat Mass (lbs) 326.0 319.0   Fat Free Mass (lbs) 223.0 225.0   Total Body Water (lbs) 163.0 164.5   MEDICATIONS:  Taking as directed at this time    Usual physical activity:  None d/t recent shoulder injury.   Progress Towards Goal(s):  In progress.   Nutritional Diagnosis:  [place]-3.3 Morbid obesity related to excessive calorie and fat intake as evidenced by patient reported food history and sedentary lifestyle.  Intervention: Nutrition education.   Goals:   Continue current dietary intake changes  Increase exercise as able - start riding your bike as often as possible  Monitoring/Evaluation: Dietary intake, exercise, and body weight in 4 week(s).

## 2012-05-21 NOTE — Patient Instructions (Addendum)
Goals:   Continue current dietary changes  Increase exercise as able - start riding your bike as often as possible  Follow-up with Dr. Daphine Deutscher

## 2012-06-18 ENCOUNTER — Encounter: Payer: PRIVATE HEALTH INSURANCE | Attending: Surgery | Admitting: *Deleted

## 2012-06-18 ENCOUNTER — Encounter: Payer: Self-pay | Admitting: *Deleted

## 2012-06-18 DIAGNOSIS — Z01818 Encounter for other preprocedural examination: Secondary | ICD-10-CM | POA: Insufficient documentation

## 2012-06-18 DIAGNOSIS — Z713 Dietary counseling and surveillance: Secondary | ICD-10-CM | POA: Insufficient documentation

## 2012-06-18 NOTE — Patient Instructions (Addendum)
Goals:   Continue current dietary changes  Increase exercise as able - start riding your bike as often as possible  Take all medications as directed - don't miss your fluid pill!! :)  Follow-up with Dr. Daphine Deutscher if you haven't heard from them by Friday (1/17)

## 2012-06-18 NOTE — Progress Notes (Signed)
Medical Nutrition Therapy  Appt start time: 1030  End time: 1100.   Primary concerns today: Nutrition Modifications for Bariatric Surgery Candidate; F/U.  Cindee Lame returns with a 27.5 lb loss of FAT MASS in last month, though has a 29 lb increase in TOTAL BODY WATER. Has discontinued all pork rinds, whipping cream, butter, etc. Has increased daily activity and reports it is much easier to get around. Plans to start riding his bike for increased exercise next month d/t timing of moving into his new house soon.  Would like f/u appt with Dr. Daphine Deutscher to ascertain where he stands in the bariatric surgery process.   TANITA  BODY COMP RESULTS  04/19/12 05/21/12 06/19/12   Weight (lbs) 549.0 544.0 555.5   BMI (kg/m^2) 75.5 74.8 76.4   Fat Mass (lbs) 326.0 319.0 291.5   Fat Free Mass (lbs) 223.0 225.0 264.0   Total Body Water (lbs) 163.0 164.5 193.5   MEDICATIONS:  Misses 2nd dose of lasix 3-4 times/week    Usual physical activity:  No structured activity at this time, though moving around more. Helped father move into his new house.   Progress Towards Goal(s):  In progress.   Nutritional Diagnosis:  [place]-3.3 Morbid obesity related to excessive calorie and fat intake as evidenced by patient reported food history and sedentary lifestyle.  Intervention: Nutrition education.   Goals:   Continue current dietary intake changes  Increase exercise as able - start riding your bike as often as possible  Monitoring/Evaluation: Dietary intake, exercise, and body weight in 6 week(s).

## 2012-07-20 ENCOUNTER — Other Ambulatory Visit: Payer: Self-pay

## 2012-07-31 ENCOUNTER — Encounter: Payer: PRIVATE HEALTH INSURANCE | Attending: Surgery | Admitting: *Deleted

## 2012-07-31 ENCOUNTER — Encounter: Payer: Self-pay | Admitting: *Deleted

## 2012-07-31 DIAGNOSIS — Z01818 Encounter for other preprocedural examination: Secondary | ICD-10-CM | POA: Insufficient documentation

## 2012-07-31 DIAGNOSIS — Z713 Dietary counseling and surveillance: Secondary | ICD-10-CM | POA: Insufficient documentation

## 2012-07-31 NOTE — Progress Notes (Signed)
Medical Nutrition Therapy:  Appt start time: 1030  End time: 1100.   Primary concerns today: Nutrition Modifications for Bariatric Surgery Candidate; F/U.  Cindee Lame continues to follow previous dietary changes.   TANITA  BODY COMP RESULTS  04/19/12 05/21/12 06/19/12 07/31/12   Weight (lbs) 549.0 544.0 555.5 558.0   BMI (kg/m^2) 75.5 74.8 76.4 76.7   Fat Mass (lbs) 326.0 319.0 291.5 --    Fat Free Mass (lbs) 223.0 225.0 264.0 --   Total Body Water (lbs) 163.0 164.5 193.5 --   MEDICATIONS:  Taking lasix and all other medications consistently now    Usual physical activity:  No structured activity at this time d/t recent snow/mud, though moving around more and states he feels great!  Progress Towards Goal(s):  In progress.   Nutritional Diagnosis:  [place]-3.3 Morbid obesity related to excessive calorie and fat intake as evidenced by patient reported food history and sedentary lifestyle.  Intervention: Nutrition education.   Goals:   Continue current dietary intake changes  Increase exercise as able - start riding your bike as often as possible  Monitoring/Evaluation: Dietary intake, exercise, and body weight in 6 week(s).

## 2012-07-31 NOTE — Patient Instructions (Addendum)
Goals:   Continue current dietary changes  Increase exercise as able - start riding your bike as often as possible

## 2012-08-22 ENCOUNTER — Telehealth (INDEPENDENT_AMBULATORY_CARE_PROVIDER_SITE_OTHER): Payer: Self-pay

## 2012-08-22 NOTE — Telephone Encounter (Signed)
Spoke with pt about moving his appt time on 3/21 from 220 to 1215.  Pt said it was possible, but wouldn't know for sure until the morning.  However, I have still moved his time (MM trying to leave as early as possible)

## 2012-08-23 ENCOUNTER — Ambulatory Visit (INDEPENDENT_AMBULATORY_CARE_PROVIDER_SITE_OTHER): Payer: Self-pay | Admitting: Surgery

## 2012-08-23 ENCOUNTER — Encounter (INDEPENDENT_AMBULATORY_CARE_PROVIDER_SITE_OTHER): Payer: Self-pay | Admitting: Surgery

## 2012-08-23 ENCOUNTER — Ambulatory Visit (INDEPENDENT_AMBULATORY_CARE_PROVIDER_SITE_OTHER): Payer: PRIVATE HEALTH INSURANCE | Admitting: Surgery

## 2012-08-23 VITALS — BP 150/92 | HR 84 | Temp 97.4°F | Resp 24 | Ht 72.0 in | Wt >= 6400 oz

## 2012-08-23 DIAGNOSIS — Z6841 Body Mass Index (BMI) 40.0 and over, adult: Secondary | ICD-10-CM

## 2012-08-23 DIAGNOSIS — I10 Essential (primary) hypertension: Secondary | ICD-10-CM

## 2012-08-23 NOTE — Patient Instructions (Signed)

## 2012-08-23 NOTE — Progress Notes (Signed)
Chief Complaint:  Super morbid obesity BMI 76   History of Present Illness:  Jason Berry is an 47 y.o. male who I saw last in 2012 and who has been working with [name] about losing weight to prepare for bariatric surgery.  He comes today with his father and fianc who has had a gastric sleeve. We talked about bypass and sleeve and I think he would be a better candidate for a sleeve because of his size although he has lost a moderate amount of his omental mass. I explained the operation in the risk limited to leaks and he is aware of the risk of mortality as well. He wants to proceed with gastric sleeve. We discussed staying on the diet prior to any  surgery and we will go on a three-week intensive weight loss diet prior to the surgery.  Past Medical History  Diagnosis Date  . Hypertension   . Gout   . Cellulitis   . Arthritis     knees  . Morbid obesity     Past Surgical History  Procedure Laterality Date  . Knee surgery      bilateral    Current Outpatient Prescriptions  Medication Sig Dispense Refill  . allopurinol (ZYLOPRIM) 300 MG tablet Take 300 mg by mouth daily.        . furosemide (LASIX) 20 MG tablet Take 20 mg by mouth 2 (two) times daily.        Marland Kitchen HYDROcodone-acetaminophen (VICODIN) 5-500 MG per tablet Take 1 tablet by mouth every 6 (six) hours as needed.        Marland Kitchen lisinopril (PRINIVIL,ZESTRIL) 40 MG tablet Take 40 mg by mouth daily.       . metFORMIN (GLUCOPHAGE) 500 MG tablet Take 500 mg by mouth daily with breakfast.      . metoprolol tartrate (LOPRESSOR) 25 MG tablet Take 25 mg by mouth 2 (two) times daily.        . sertraline (ZOLOFT) 100 MG tablet Take 100 mg by mouth daily.        Marland Kitchen SIMVASTATIN PO Take by mouth daily.        No current facility-administered medications for this visit.   Percocet; Sulfa drugs cross reactors; and Topamax Family History  Problem Relation Age of Onset  . Hypertension Mother   . Diabetes Mother   . Hypertension Father    Social  History:   reports that he has never smoked. He does not have any smokeless tobacco history on file. He reports that he drinks about 1.2 ounces of alcohol per week. He reports that he does not use illicit drugs.   REVIEW OF SYSTEMS - PERTINENT POSITIVES ONLY:   Physical Exam:   Blood pressure 150/92, pulse 84, temperature 97.4 F (36.3 C), temperature source Temporal, resp. rate 24, height 6' (1.829 m), weight 563 lb 12.8 oz (255.738 kg). Body mass index is 76.45 kg/(m^2).  Gen:  WDWN WM NAD  Neurological: Alert and oriented to person, place, and time. Motor and sensory function is grossly intact  Head: Normocephalic and atraumatic.  Eyes: Conjunctivae are normal. Pupils are equal, round, and reactive to light. No scleral icterus.  Neck: Normal range of motion. Neck supple. No tracheal deviation or thyromegaly present.  Cardiovascular:  SR without murmurs or gallops.  No carotid bruits Respiratory: Effort normal.  No respiratory distress. No chest wall tenderness. Breath sounds normal.  No wheezes, rales or rhonchi.  Abdomen:  Very large and nontender GU: Musculoskeletal: Normal  range of motion. Extremities are nontender. No cyanosis, edema or clubbing noted Lymphadenopathy: No cervical, preauricular, postauricular or axillary adenopathy is present Skin: Skin is warm and dry. No rash noted. No diaphoresis. No erythema. No pallor. Pscyh: Normal mood and affect. Behavior is normal. Judgment and thought content normal.   LABORATORY RESULTS: No results found for this or any previous visit (from the past 48 hour(s)).  RADIOLOGY RESULTS: No results found.  Problem List: There is no problem list on file for this patient.   Assessment & Plan: Super morbid obesity  Sleeve Gastrectomy    Matt B. Daphine Deutscher, MD, [facility] [phone] beeper [phone]  08/23/2012 1:34 PM

## 2012-09-12 NOTE — Progress Notes (Signed)
Surgery scheduled for 09/30/12.  Preop appointment on 09/24/12 at 0800am.  Need orders in EPIC.  Thanks.

## 2012-09-13 ENCOUNTER — Encounter (HOSPITAL_COMMUNITY): Payer: Self-pay | Admitting: Pharmacy Technician

## 2012-09-19 ENCOUNTER — Ambulatory Visit (INDEPENDENT_AMBULATORY_CARE_PROVIDER_SITE_OTHER): Payer: PRIVATE HEALTH INSURANCE | Admitting: Surgery

## 2012-09-19 ENCOUNTER — Encounter (INDEPENDENT_AMBULATORY_CARE_PROVIDER_SITE_OTHER): Payer: Self-pay | Admitting: Surgery

## 2012-09-19 ENCOUNTER — Encounter: Payer: Self-pay | Admitting: *Deleted

## 2012-09-19 ENCOUNTER — Encounter: Payer: Self-pay | Attending: Surgery

## 2012-09-19 VITALS — BP 142/88 | HR 86 | Temp 97.1°F | Resp 20 | Ht 72.0 in | Wt >= 6400 oz

## 2012-09-19 DIAGNOSIS — Z6841 Body Mass Index (BMI) 40.0 and over, adult: Secondary | ICD-10-CM

## 2012-09-19 DIAGNOSIS — Z713 Dietary counseling and surveillance: Secondary | ICD-10-CM | POA: Insufficient documentation

## 2012-09-19 DIAGNOSIS — Z01818 Encounter for other preprocedural examination: Secondary | ICD-10-CM | POA: Insufficient documentation

## 2012-09-19 NOTE — Progress Notes (Signed)
Patient was seen on 09/19/2012 for Pre-Operative Bariatric Surgery Education at the [facility].    The following the learning objective met by the patient during this course:  Identifies Pre-Op Dietary Goals and will begin 2 weeks pre-operatively  Identifies appropriate sources of fluids and proteins  States protein recommendations and appropriate sources pre and post-operatively  Identifies Post-Operative Dietary Goals and will follow for 2 weeks post-operatively  Identifies appropriate multivitamin and calcium sources  Describes the need for physical activity post-operatively and will follow MD recommendations  States when to call healthcare provider regarding medication questions or post-operative complications  Handouts given during class include:  Pre-Op Bariatric Surgery Diet Handout  Protein Shake Handout  Post-Op Bariatric Surgery Nutrition Handout  BELT Program Information Flyer  Support Group Information Flyer  Follow-Up Plan:  Patient will follow-up at [facility] 2 weeks post operatively for diet advancement per MD.  Patient Instructions  Follow:  Pre-Op Diet per MD 2 weeks prior to surgery  Phase 2- Liquids (clear/full) 2 weeks after surgery  Vitamin/Mineral/Calcium guidelines for purchasing bariatric supplements  Exercise guidelines pre and post-op per MD  Follow-up at [facility] in 2 weeks post-op for diet advancement. Contact Sara Himmelrich as needed with questions/concerns.   

## 2012-09-19 NOTE — Progress Notes (Signed)
Chief Complaint:  Super morbid obesity BMI 76   History of Present Illness:  Jason Berry is an 47 y.o. male who I saw last in 2012 and who has been working with [name] about losing weight to prepare for bariatric surgery.  He comes today with his father and fianc who has had a gastric sleeve. We talked about bypass and sleeve and I think he would be a better candidate for a sleeve because of his size although he has lost a moderate amount of his omental mass. I explained the operation in the risk limited to leaks and he is aware of the risk of mortality as well. He wants to proceed with gastric sleeve. We discussed staying on the diet prior to any  surgery and we will go on a three-week intensive weight loss diet prior to the surgery.  Past Medical History  Diagnosis Date  . Hypertension   . Gout   . Cellulitis   . Arthritis     knees  . Morbid obesity     Past Surgical History  Procedure Laterality Date  . Knee surgery      bilateral    Current Outpatient Prescriptions  Medication Sig Dispense Refill  . allopurinol (ZYLOPRIM) 300 MG tablet Take 300 mg by mouth daily.        . cetirizine (ZYRTEC) 10 MG tablet Take 10 mg by mouth daily.      Marland Kitchen ECHINACEA PO Take 1 tablet by mouth as directed. Takes for 21 days and stops for 21 days( not taking at this time)      . fish oil-omega-3 fatty acids 1000 MG capsule Take 1 g by mouth daily.      . furosemide (LASIX) 20 MG tablet Take 20 mg by mouth 2 (two) times daily.        Marland Kitchen HYDROcodone-acetaminophen (VICODIN) 5-500 MG per tablet Take 1 tablet by mouth every 6 (six) hours as needed for pain.       Marland Kitchen lisinopril (PRINIVIL,ZESTRIL) 40 MG tablet Take 40 mg by mouth 2 (two) times daily.       Marland Kitchen lovastatin (MEVACOR) 20 MG tablet Take 20 mg by mouth at bedtime.      . metFORMIN (GLUCOPHAGE) 500 MG tablet Take 500 mg by mouth daily with breakfast.      . metoprolol tartrate (LOPRESSOR) 25 MG tablet Take 25 mg by mouth 2 (two) times daily.       .  Multiple Vitamin (MULTIVITAMIN WITH MINERALS) TABS Take 1 tablet by mouth daily.      . sertraline (ZOLOFT) 100 MG tablet Take 150 mg by mouth daily before breakfast.       . vitamin A 16109 UNIT capsule Take 10,000 Units by mouth daily.      . vitamin C (ASCORBIC ACID) 500 MG tablet Take 500 mg by mouth daily.      . vitamin E 400 UNIT capsule Take 400 Units by mouth daily.       No current facility-administered medications for this visit.   Percocet; Sulfa drugs cross reactors; and Topamax Family History  Problem Relation Age of Onset  . Hypertension Mother   . Diabetes Mother   . Hypertension Father    Social History:   reports that he has never smoked. He does not have any smokeless tobacco history on file. He reports that he drinks about 1.2 ounces of alcohol per week. He reports that he does not use illicit drugs.   REVIEW  OF SYSTEMS - PERTINENT POSITIVES ONLY:   Physical Exam:   Blood pressure 142/88, pulse 86, temperature 97.1 F (36.2 C), temperature source Temporal, resp. rate 20, height 6' (1.829 m), weight 571 lb (259.004 kg). Body mass index is 77.42 kg/(m^2).  Gen:  WDWN WM NAD  Neurological: Alert and oriented to person, place, and time. Motor and sensory function is grossly intact  Head: Normocephalic and atraumatic.  Eyes: Conjunctivae are normal. Pupils are equal, round, and reactive to light. No scleral icterus.  Neck: Normal range of motion. Neck supple. No tracheal deviation or thyromegaly present.  Cardiovascular:  SR without murmurs or gallops.  No carotid bruits Respiratory: Effort normal.  No respiratory distress. No chest wall tenderness. Breath sounds normal.  No wheezes, rales or rhonchi.  Abdomen:  Very large and nontender GU: Musculoskeletal: Normal range of motion. Extremities are nontender. No cyanosis, edema or clubbing noted Lymphadenopathy: No cervical, preauricular, postauricular or axillary adenopathy is present Skin: Skin is warm and dry. No  rash noted. No diaphoresis. No erythema. No pallor. Pscyh: Normal mood and affect. Behavior is normal. Judgment and thought content normal.   LABORATORY RESULTS: No results found for this or any previous visit (from the past 48 hour(s)).  RADIOLOGY RESULTS: No results found.  Problem List: Patient Active Problem List  Diagnosis  . Morbid obesity with BMI of 70 and over, adult    Assessment & Plan: Super morbid obesity  Sleeve Gastrectomy  Bowel prep given.  Instructions for diet given.  Ready for surgery in 11 days.  No further questions    Matt B. Daphine Deutscher, MD, [facility] [phone] beeper [phone]  09/19/2012 4:11 PM

## 2012-09-19 NOTE — Patient Instructions (Addendum)

## 2012-09-23 NOTE — Progress Notes (Signed)
DR Daphine Deutscher-   Need PRE OP ORDERS PLEASE- has pst appt 09/24/12 [facility]

## 2012-09-23 NOTE — Patient Instructions (Addendum)
20 Jason Berry  09/23/2012   Your procedure is scheduled on:  09/30/12  MONDAY  Report to [facility] at  0815     AM.  Call this number if you have problems the morning of surgery: [phone]       Remember: BRING CPAP MASK AND TUBING WITH YOU TO HOSPITAL  Do not eat food  Or drink :After Midnight. Sunday NIGHT   Take these medicines the morning of surgery with A SIP OF WATER:  METOPROLOL, ZOLOFT, Zyrtec DO NOT TAKE ANY DIABETIC MEDICINE MORNING OF SURGERY  .  Contacts, dentures or partial plates can not be worn to surgery  Leave suitcase in the car. After surgery it may be brought to your room.  For patients admitted to the hospital, checkout time is 11:00 AM day of  discharge.             SPECIAL INSTRUCTIONS- SEE [facility] PREPARING FOR SURGERY INSTRUCTION SHEET-     DO NOT WEAR JEWELRY, LOTIONS, POWDERS, OR PERFUMES.  WOMEN-- DO NOT SHAVE LEGS OR UNDERARMS FOR 12 HOURS BEFORE SHOWERS. MEN MAY SHAVE FACE.  Patients discharged the day of surgery will not be allowed to drive home. IF going home the day of surgery, you must have a driver and someone to stay with you for the first 24 hours  Name and phone number of your driver:      [email or web address]                                                                  Please read over the following fact sheets that you were given: MRSA Information, Incentive Spirometry Sheet, Blood Transfusion Sheet  Information                                                                                   [name]  PST [phone]  [phone]                 FAILURE TO FOLLOW THESE INSTRUCTIONS MAY RESULT IN  CANCELLATION   OF YOUR SURGERY                                                  Patient Signature _____________________________

## 2012-09-24 ENCOUNTER — Encounter (HOSPITAL_COMMUNITY)
Admission: RE | Admit: 2012-09-24 | Discharge: 2012-09-24 | Disposition: A | Discharge status: Home / Self Care | Payer: PRIVATE HEALTH INSURANCE | Source: Ambulatory Visit | Attending: Surgery | Admitting: Surgery

## 2012-09-24 ENCOUNTER — Ambulatory Visit (HOSPITAL_COMMUNITY)
Admission: RE | Admit: 2012-09-24 | Discharge: 2012-09-24 | Disposition: A | Discharge status: Home / Self Care | Payer: PRIVATE HEALTH INSURANCE | Source: Ambulatory Visit | Attending: Surgery | Admitting: Surgery

## 2012-09-24 ENCOUNTER — Encounter (HOSPITAL_COMMUNITY): Payer: Self-pay

## 2012-09-24 DIAGNOSIS — Z8639 Personal history of other endocrine, nutritional and metabolic disease: Secondary | ICD-10-CM | POA: Insufficient documentation

## 2012-09-24 DIAGNOSIS — Z862 Personal history of diseases of the blood and blood-forming organs and certain disorders involving the immune mechanism: Secondary | ICD-10-CM | POA: Insufficient documentation

## 2012-09-24 DIAGNOSIS — I10 Essential (primary) hypertension: Secondary | ICD-10-CM | POA: Insufficient documentation

## 2012-09-24 DIAGNOSIS — Z01810 Encounter for preprocedural cardiovascular examination: Secondary | ICD-10-CM | POA: Insufficient documentation

## 2012-09-24 DIAGNOSIS — Z01812 Encounter for preprocedural laboratory examination: Secondary | ICD-10-CM | POA: Insufficient documentation

## 2012-09-24 DIAGNOSIS — Z01818 Encounter for other preprocedural examination: Secondary | ICD-10-CM | POA: Insufficient documentation

## 2012-09-24 HISTORY — DX: Sleep apnea, unspecified: G47.30

## 2012-09-24 HISTORY — DX: Depression, unspecified: F32.A

## 2012-09-24 HISTORY — DX: Prediabetes: R73.03

## 2012-09-24 HISTORY — DX: Major depressive disorder, single episode, unspecified: F32.9

## 2012-09-24 LAB — BASIC METABOLIC PANEL
Calcium: 10.6 mg/dL — ABNORMAL HIGH (ref 8.4–10.5)
Creatinine, Ser: 0.74 mg/dL (ref 0.50–1.35)
GFR calc Af Amer: >90 mL/min (ref >90)
GFR calc non Af Amer: >90 mL/min (ref >90)

## 2012-09-24 LAB — CBC
MCH: 28.9 pg (ref 26.0–34.0)
MCHC: 32.9 g/dL (ref 30.0–36.0)
MCV: 87.8 fL (ref 78.0–100.0)
Platelets: 271 10*3/uL (ref 150–400)
RDW: 14.3 % (ref 11.5–15.5)
WBC: 6.2 10*3/uL (ref 4.0–10.5)

## 2012-09-24 NOTE — Progress Notes (Signed)
Notified Christina, portable equipment of need for bariatric bed day of surgery

## 2012-09-24 NOTE — Progress Notes (Signed)
Patient states has bowel prep instructions from office

## 2012-09-27 NOTE — Progress Notes (Signed)
Patient notified of surgery change time and to arrive in short stay at 0740 AM

## 2012-09-29 ENCOUNTER — Other Ambulatory Visit (INDEPENDENT_AMBULATORY_CARE_PROVIDER_SITE_OTHER): Payer: Self-pay | Admitting: Surgery

## 2012-09-29 NOTE — Progress Notes (Signed)
DOCUMENTATION ONLY ON BEHALF OF Mercy Riding, RN FROM PRE-OP CLASS 09/19/12  TANITA BODY COMP RESULTS   04/19/12  05/21/12  06/19/12  07/31/12  09/19/12  Weight (lbs)  549.0  544.0  555.5  558.0  568.0  BMI (kg/m^2)  75.5  74.8  76.4  76.7  78.1  Fat Mass (lbs)  326.0  319.0  291.5  --  320.5  Fat Free Mass (lbs)  223.0  225.0  264.0  --  247.5  Total Body Water (lbs)  163.0  164.5  193.5  --  181.0

## 2012-09-30 ENCOUNTER — Encounter (HOSPITAL_COMMUNITY)
Admission: RE | Disposition: A | Discharge status: Home / Self Care | Payer: Self-pay | Source: Ambulatory Visit | Attending: Surgery

## 2012-09-30 ENCOUNTER — Encounter (HOSPITAL_COMMUNITY): Payer: Self-pay | Admitting: Anesthesiology

## 2012-09-30 ENCOUNTER — Inpatient Hospital Stay (HOSPITAL_COMMUNITY)
Admission: RE | Admit: 2012-09-30 | Discharge: 2012-10-03 | DRG: 621 | Disposition: A | Discharge status: Home / Self Care | Payer: PRIVATE HEALTH INSURANCE | Source: Ambulatory Visit | Attending: Surgery | Admitting: Surgery

## 2012-09-30 ENCOUNTER — Encounter (HOSPITAL_COMMUNITY): Payer: Self-pay | Admitting: *Deleted

## 2012-09-30 ENCOUNTER — Inpatient Hospital Stay (HOSPITAL_COMMUNITY): Payer: PRIVATE HEALTH INSURANCE | Admitting: Anesthesiology

## 2012-09-30 DIAGNOSIS — I10 Essential (primary) hypertension: Secondary | ICD-10-CM | POA: Diagnosis present

## 2012-09-30 DIAGNOSIS — M1710 Unilateral primary osteoarthritis, unspecified knee: Secondary | ICD-10-CM

## 2012-09-30 DIAGNOSIS — Z6841 Body Mass Index (BMI) 40.0 and over, adult: Secondary | ICD-10-CM

## 2012-09-30 DIAGNOSIS — M109 Gout, unspecified: Secondary | ICD-10-CM | POA: Diagnosis present

## 2012-09-30 DIAGNOSIS — G4730 Sleep apnea, unspecified: Secondary | ICD-10-CM | POA: Diagnosis present

## 2012-09-30 DIAGNOSIS — Z79899 Other long term (current) drug therapy: Secondary | ICD-10-CM

## 2012-09-30 HISTORY — PX: LAPAROSCOPIC GASTRIC SLEEVE RESECTION: SHX5895

## 2012-09-30 HISTORY — PX: ESOPHAGOGASTRODUODENOSCOPY: SHX5428

## 2012-09-30 LAB — CBC
MCH: 28.8 pg (ref 26.0–34.0)
MCV: 87.5 fL (ref 78.0–100.0)
Platelets: 224 10*3/uL (ref 150–400)
RBC: 5.14 MIL/uL (ref 4.22–5.81)

## 2012-09-30 SURGERY — GASTRECTOMY, SLEEVE, LAPAROSCOPIC
Anesthesia: General | Site: Esophagus | Wound class: Clean Contaminated

## 2012-09-30 MED ORDER — LACTATED RINGERS IR SOLN
Status: DC | PRN
  Administered 2012-09-30: 1000 mL

## 2012-09-30 MED ORDER — MEPERIDINE HCL 50 MG/ML IJ SOLN: 6.2500 mg | INTRAMUSCULAR | Status: DC | PRN

## 2012-09-30 MED ORDER — DEXTROSE 5 % IV SOLN
2.0000 g | INTRAVENOUS | Status: DC | PRN
  Administered 2012-09-30: 2 g via INTRAVENOUS

## 2012-09-30 MED ORDER — HEPARIN SODIUM (PORCINE) 5000 UNIT/ML IJ SOLN
5000.0000 [IU] | Freq: Three times a day (TID) | INTRAMUSCULAR | Status: DC
  Administered 2012-09-30 – 2012-10-03 (×9): 5000 [IU] via SUBCUTANEOUS
  Filled 2012-09-30 (×11): qty 1

## 2012-09-30 MED ORDER — FENTANYL CITRATE 0.05 MG/ML IJ SOLN
INTRAMUSCULAR | Status: DC | PRN
  Administered 2012-09-30: 100 ug via INTRAVENOUS
  Administered 2012-09-30: 50 ug via INTRAVENOUS
  Administered 2012-09-30: 100 ug via INTRAVENOUS
  Administered 2012-09-30 (×2): 50 ug via INTRAVENOUS
  Administered 2012-09-30: 100 ug via INTRAVENOUS
  Administered 2012-09-30: 50 ug via INTRAVENOUS
  Administered 2012-09-30: 100 ug via INTRAVENOUS

## 2012-09-30 MED ORDER — ONDANSETRON HCL 4 MG/2ML IJ SOLN: 4.0000 mg | INTRAMUSCULAR | Status: DC | PRN

## 2012-09-30 MED ORDER — GLYCOPYRROLATE 0.2 MG/ML IJ SOLN
INTRAMUSCULAR | Status: DC | PRN
  Administered 2012-09-30: 0.6 mg via INTRAVENOUS
  Administered 2012-09-30: 0.4 mg via INTRAVENOUS

## 2012-09-30 MED ORDER — NEOSTIGMINE METHYLSULFATE 1 MG/ML IJ SOLN
INTRAMUSCULAR | Status: DC | PRN
  Administered 2012-09-30: 5 mg via INTRAVENOUS

## 2012-09-30 MED ORDER — ACETAMINOPHEN 10 MG/ML IV SOLN: 1000.0000 mg | Freq: Once | INTRAVENOUS | Status: DC | PRN

## 2012-09-30 MED ORDER — PROMETHAZINE HCL 25 MG/ML IJ SOLN: 6.2500 mg | INTRAMUSCULAR | Status: DC | PRN

## 2012-09-30 MED ORDER — KCL IN DEXTROSE-NACL 20-5-0.45 MEQ/L-%-% IV SOLN
INTRAVENOUS | Status: DC
  Administered 2012-09-30: 18:00:00 via INTRAVENOUS
  Administered 2012-10-01: 100 mL/h via INTRAVENOUS
  Administered 2012-10-01 – 2012-10-02 (×2): via INTRAVENOUS
  Filled 2012-09-30 (×5): qty 1000

## 2012-09-30 MED ORDER — BUPIVACAINE LIPOSOME 1.3 % IJ SUSP
20.0000 mL | Freq: Once | INTRAMUSCULAR | Status: DC
  Filled 2012-09-30: qty 20

## 2012-09-30 MED ORDER — PROPOFOL 10 MG/ML IV EMUL
INTRAVENOUS | Status: DC | PRN
  Administered 2012-09-30: 50 mg via INTRAVENOUS
  Administered 2012-09-30: 300 mg via INTRAVENOUS

## 2012-09-30 MED ORDER — ACETAMINOPHEN 160 MG/5ML PO SOLN
650.0000 mg | ORAL | Status: DC | PRN
  Filled 2012-09-30: qty 20.3

## 2012-09-30 MED ORDER — CISATRACURIUM BESYLATE (PF) 10 MG/5ML IV SOLN
INTRAVENOUS | Status: DC | PRN
  Administered 2012-09-30 (×2): 4 mg via INTRAVENOUS
  Administered 2012-09-30 (×2): 2 mg via INTRAVENOUS
  Administered 2012-09-30: 4 mg via INTRAVENOUS
  Administered 2012-09-30: 3 mg via INTRAVENOUS
  Administered 2012-09-30: 10 mg via INTRAVENOUS
  Administered 2012-09-30: 4 mg via INTRAVENOUS
  Administered 2012-09-30 (×2): 2 mg via INTRAVENOUS

## 2012-09-30 MED ORDER — ACETAMINOPHEN 10 MG/ML IV SOLN
INTRAVENOUS | Status: DC | PRN
  Administered 2012-09-30: 1000 mg via INTRAVENOUS

## 2012-09-30 MED ORDER — UNJURY CHICKEN SOUP POWDER: 2.0000 [oz_av] | Freq: Four times a day (QID) | ORAL | Status: DC

## 2012-09-30 MED ORDER — NEOSTIGMINE METHYLSULFATE 1 MG/ML IJ SOLN: INTRAMUSCULAR | Status: DC | PRN

## 2012-09-30 MED ORDER — HYDRALAZINE HCL 20 MG/ML IJ SOLN
5.0000 mg | INTRAMUSCULAR | Status: AC | PRN
  Administered 2012-09-30 (×4): 5 mg via INTRAVENOUS

## 2012-09-30 MED ORDER — DEXTROSE 5 % IV SOLN
2.0000 g | INTRAVENOUS | Status: DC
  Filled 2012-09-30: qty 2

## 2012-09-30 MED ORDER — ONDANSETRON HCL 4 MG/2ML IJ SOLN
INTRAMUSCULAR | Status: DC | PRN
  Administered 2012-09-30: 4 mg via INTRAVENOUS

## 2012-09-30 MED ORDER — MORPHINE SULFATE 10 MG/ML IJ SOLN
2.0000 mg | INTRAMUSCULAR | Status: DC | PRN
  Administered 2012-09-30 – 2012-10-01 (×8): 6 mg via INTRAVENOUS
  Administered 2012-10-02: 4 mg via INTRAVENOUS
  Filled 2012-09-30 (×9): qty 1

## 2012-09-30 MED ORDER — SUCCINYLCHOLINE CHLORIDE 20 MG/ML IJ SOLN
INTRAMUSCULAR | Status: DC | PRN
  Administered 2012-09-30: 200 mg via INTRAVENOUS

## 2012-09-30 MED ORDER — KETAMINE HCL 10 MG/ML IJ SOLN
INTRAMUSCULAR | Status: DC | PRN
  Administered 2012-09-30 (×3): 10 mg via INTRAVENOUS

## 2012-09-30 MED ORDER — UNJURY VANILLA POWDER
2.0000 [oz_av] | Freq: Four times a day (QID) | ORAL | Status: DC
  Administered 2012-10-02 (×3): 2 [oz_av] via ORAL

## 2012-09-30 MED ORDER — UNJURY CHOCOLATE CLASSIC POWDER
2.0000 [oz_av] | Freq: Four times a day (QID) | ORAL | Status: DC
  Administered 2012-10-02 – 2012-10-03 (×2): 2 [oz_av] via ORAL

## 2012-09-30 MED ORDER — TISSEEL VH 10 ML EX KIT
PACK | CUTANEOUS | Status: DC | PRN
  Administered 2012-09-30: 10 mL

## 2012-09-30 MED ORDER — LACTATED RINGERS IV SOLN
INTRAVENOUS | Status: DC | PRN
  Administered 2012-09-30 (×3): via INTRAVENOUS

## 2012-09-30 MED ORDER — MIDAZOLAM HCL 5 MG/5ML IJ SOLN
INTRAMUSCULAR | Status: DC | PRN
  Administered 2012-09-30: 1 mg via INTRAVENOUS

## 2012-09-30 MED ORDER — OXYCODONE-ACETAMINOPHEN 5-325 MG/5ML PO SOLN: 5.0000 mL | ORAL | Status: DC | PRN

## 2012-09-30 MED ORDER — HEPARIN SODIUM (PORCINE) 5000 UNIT/ML IJ SOLN
5000.0000 [IU] | INTRAMUSCULAR | Status: AC
  Administered 2012-09-30: 5000 [IU] via SUBCUTANEOUS
  Filled 2012-09-30: qty 1

## 2012-09-30 MED ORDER — LIDOCAINE HCL (CARDIAC) 20 MG/ML IV SOLN
INTRAVENOUS | Status: DC | PRN
  Administered 2012-09-30: 30 mg via INTRAVENOUS

## 2012-09-30 MED ORDER — HYDROMORPHONE HCL PF 1 MG/ML IJ SOLN
INTRAMUSCULAR | Status: DC | PRN
  Administered 2012-09-30 (×2): 0.5 mg via INTRAVENOUS
  Administered 2012-09-30 (×2): 1 mg via INTRAVENOUS
  Administered 2012-09-30 (×2): 0.5 mg via INTRAVENOUS

## 2012-09-30 MED ORDER — HYDROMORPHONE HCL PF 1 MG/ML IJ SOLN
0.2500 mg | INTRAMUSCULAR | Status: DC | PRN
  Administered 2012-09-30: 0.5 mg via INTRAVENOUS

## 2012-09-30 MED ORDER — BUPIVACAINE LIPOSOME 1.3 % IJ SUSP
INTRAMUSCULAR | Status: DC | PRN
  Administered 2012-09-30: 20 mL

## 2012-09-30 SURGICAL SUPPLY — 61 items
APPLICATOR COTTON TIP 6IN STRL (MISCELLANEOUS) IMPLANT
APPLIER CLIP ROT 10 11.4 M/L (STAPLE) ×3
CABLE HIGH FREQUENCY MONO STRZ (ELECTRODE) ×3 IMPLANT
CANISTER SUCTION 2500CC (MISCELLANEOUS) ×3 IMPLANT
CLIP APPLIE ROT 10 11.4 M/L (STAPLE) ×2 IMPLANT
CLOTH BEACON ORANGE TIMEOUT ST (SAFETY) ×3 IMPLANT
COVER SURGICAL LIGHT HANDLE (MISCELLANEOUS) IMPLANT
DERMABOND ADVANCED (GAUZE/BANDAGES/DRESSINGS) ×2
DERMABOND ADVANCED .7 DNX12 (GAUZE/BANDAGES/DRESSINGS) ×4 IMPLANT
DEVICE SUTURE ENDOST 10MM (ENDOMECHANICALS) IMPLANT
DEVICE TROCAR PUNCTURE CLOSURE (ENDOMECHANICALS) IMPLANT
DRAIN CHANNEL 19F RND (DRAIN) ×3 IMPLANT
DRAPE CAMERA CLOSED 9X96 (DRAPES) ×3 IMPLANT
DRAPE LAPAROSCOPIC ABDOMINAL (DRAPES) ×3 IMPLANT
ELECT REM PT RETURN 9FT ADLT (ELECTROSURGICAL) ×3
ELECTRODE REM PT RTRN 9FT ADLT (ELECTROSURGICAL) ×2 IMPLANT
EVACUATOR DRAINAGE 10X20 100CC (DRAIN) ×2 IMPLANT
EVACUATOR SILICONE 100CC (DRAIN) ×4 IMPLANT
GLOVE BIO SURGEON STRL SZ8 (GLOVE) ×3 IMPLANT
GOWN STRL NON-REIN LRG LVL3 (GOWN DISPOSABLE) IMPLANT
GOWN STRL REIN XL XLG (GOWN DISPOSABLE) ×15 IMPLANT
HOVERMATT SINGLE USE (MISCELLANEOUS) ×3 IMPLANT
KIT BASIN OR (CUSTOM PROCEDURE TRAY) ×3 IMPLANT
MARKER SKIN DUAL TIP RULER LAB (MISCELLANEOUS) ×3 IMPLANT
NEEDLE SPNL 22GX3.5 QUINCKE BK (NEEDLE) ×3 IMPLANT
NS IRRIG 1000ML POUR BTL (IV SOLUTION) ×3 IMPLANT
PACK UNIVERSAL I (CUSTOM PROCEDURE TRAY) ×3 IMPLANT
PENCIL BUTTON HOLSTER BLD 10FT (ELECTRODE) ×3 IMPLANT
POUCH SPECIMEN RETRIEVAL 10MM (ENDOMECHANICALS) IMPLANT
RELOAD BLUE (STAPLE) ×3 IMPLANT
RELOAD ENDO STITCH (ENDOMECHANICALS) IMPLANT
RELOAD GOLD (STAPLE) ×9 IMPLANT
RELOAD GREEN (STAPLE) ×6 IMPLANT
SCISSORS LAP 5X35 DISP (ENDOMECHANICALS) ×3 IMPLANT
SEALANT SURGICAL APPL DUAL CAN (MISCELLANEOUS) ×3 IMPLANT
SET IRRIG TUBING LAPAROSCOPIC (IRRIGATION / IRRIGATOR) ×3 IMPLANT
SHEARS CURVED HARMONIC AC 45CM (MISCELLANEOUS) ×3 IMPLANT
SOLUTION ANTI FOG 6CC (MISCELLANEOUS) ×3 IMPLANT
SPONGE DRAIN TRACH 4X4 STRL 2S (GAUZE/BANDAGES/DRESSINGS) ×3 IMPLANT
SPONGE GAUZE 4X4 12PLY (GAUZE/BANDAGES/DRESSINGS) IMPLANT
SPONGE LAP 18X18 X RAY DECT (DISPOSABLE) ×3 IMPLANT
STAPLE ECHEON FLEX 60 POW ENDO (STAPLE) IMPLANT
SUT ETHILON 2 0 PS N (SUTURE) ×3 IMPLANT
SUT MNCRL AB 4-0 PS2 18 (SUTURE) ×3 IMPLANT
SUT SILK 2 0 (SUTURE) ×1
SUT SILK 2-0 18XBRD TIE 12 (SUTURE) ×2 IMPLANT
SUT VIC AB 4-0 SH 18 (SUTURE) ×3 IMPLANT
SYR 50ML LL SCALE MARK (SYRINGE) ×3 IMPLANT
TRAY FOLEY CATH 14FRSI W/METER (CATHETERS) ×3 IMPLANT
TRAY LAP CHOLE (CUSTOM PROCEDURE TRAY) ×3 IMPLANT
TROCAR 12M 150ML BLUNT (TROCAR) ×15 IMPLANT
TROCAR ADV FIXATION 12X100MM (TROCAR) ×3 IMPLANT
TROCAR BALLN 12MMX100 BLUNT (TROCAR) IMPLANT
TROCAR BLADELESS 15MM (ENDOMECHANICALS) ×3 IMPLANT
TROCAR BLADELESS OPT 12M 100M (ENDOMECHANICALS) IMPLANT
TROCAR BLADELESS OPT 5 100 (ENDOMECHANICALS) IMPLANT
TROCAR BLADELESS OPT 5 150 (ENDOMECHANICALS) ×6 IMPLANT
TROCAR XCEL NON-BLD 5MMX100MML (ENDOMECHANICALS) IMPLANT
TUBING CONNECTING 10 (TUBING) ×3 IMPLANT
TUBING ENDO SMARTCAP (MISCELLANEOUS) ×3 IMPLANT
TUBING FILTER THERMOFLATOR (ELECTROSURGICAL) ×3 IMPLANT

## 2012-09-30 NOTE — Op Note (Signed)
Surgeon: Wenda Low, MD, FACS  Asst:  Lodema Pilot, DO  Anes:  general  Procedure: Laparoscopic sleeve gastrostomy and endoscopy  Diagnosis: Morbid obesity, BMI 75 +  Complications: none  EBL:   15 cc  Description of Procedure:  The patient was taken to or 1 and given general anesthesia. A Foley catheter was inserted and his abdomen was prepped with PCMX and draped sterilely. A timeout was performed. A 10 cm 11-11 cm incision was made in the left upper quadrant I dissected down my finger to get interject free below the ribs and then we passed 150 mm 12 trocar. It was in an entered and insufflated. Patient had a large amount of fat including his omentum and tissue up around the stomach. There were no adhesions to the intra-abdominal wall. We placed multiple 12 6 and one 15 trocar in the upper midline and initially for extraction site.  The pylorus was identified and we measured proximally 5 cm cephalad from the pylorus. There I began taking down the gastroepiploic vessels heading cephalad toward the left crus.  This was done with some difficulty just because of his size and how high his stomach was a relationship to the patient is ribs. Eventually placed in the upper midline port to give a better exposure in the left crus.  After taking these down with the harmonic scalpel I then went back to the previously marked area to begin the transection of the stomach. There identified crow's  Foot And made my first firing to give me plan room and not to compromise the incisura.  A green load was used initially. 36 bougie was passed to the pylorus it was manipulated before each firing of the stapler. I then cut straight on creating a sleeve without twist and I was able to then employed gold load staples for the Echelon stapler.  This was taken up to the left crus and went to the left of the fat pad.  Dr. Darylene Price then endoscoped the patient and no bubbles were seen. No bleeding was seen the inside. There  were a few oozing areas along stapler which were controlled with a clip applier and subsequently Tisseel. A drain was placed anteriorly the omentum was brought up on portion of the sleeve. The remnant stomach was taken out through a 15 port in the upper midline which I then had to close anteriorly with 0 Vicryl. Wounds were injected with Exparel closed with 4-0 Vicryl and Dermabond. Patient our the procedure well and be taken to step down postop.  Matt B. Daphine Deutscher, MD, [facility], [place] [phone]

## 2012-09-30 NOTE — Progress Notes (Signed)
Dr. Rica Mast, anesthesiologist, made aware of pt's Blood pressure after meds in PACU and pt's preop BP; OK to transfer to stepdown

## 2012-09-30 NOTE — H&P (View-Only) (Signed)
Chief Complaint:  Super morbid obesity BMI 76   History of Present Illness:  Jason Berry is an 47 y.o. male who I saw last in 2012 and who has been working with Sara about losing weight to prepare for bariatric surgery.  He comes today with his father and fianc who has had a gastric sleeve. We talked about bypass and sleeve and I think he would be a better candidate for a sleeve because of his size although he has lost a moderate amount of his omental mass. I explained the operation in the risk limited to leaks and he is aware of the risk of mortality as well. He wants to proceed with gastric sleeve. We discussed staying on the diet prior to any  surgery and we will go on a three-week intensive weight loss diet prior to the surgery.  Past Medical History  Diagnosis Date  . Hypertension   . Gout   . Cellulitis   . Arthritis     knees  . Morbid obesity     Past Surgical History  Procedure Laterality Date  . Knee surgery      bilateral    Current Outpatient Prescriptions  Medication Sig Dispense Refill  . allopurinol (ZYLOPRIM) 300 MG tablet Take 300 mg by mouth daily.        . cetirizine (ZYRTEC) 10 MG tablet Take 10 mg by mouth daily.      . ECHINACEA PO Take 1 tablet by mouth as directed. Takes for 21 days and stops for 21 days( not taking at this time)      . fish oil-omega-3 fatty acids 1000 MG capsule Take 1 g by mouth daily.      . furosemide (LASIX) 20 MG tablet Take 20 mg by mouth 2 (two) times daily.        . HYDROcodone-acetaminophen (VICODIN) 5-500 MG per tablet Take 1 tablet by mouth every 6 (six) hours as needed for pain.       . lisinopril (PRINIVIL,ZESTRIL) 40 MG tablet Take 40 mg by mouth 2 (two) times daily.       . lovastatin (MEVACOR) 20 MG tablet Take 20 mg by mouth at bedtime.      . metFORMIN (GLUCOPHAGE) 500 MG tablet Take 500 mg by mouth daily with breakfast.      . metoprolol tartrate (LOPRESSOR) 25 MG tablet Take 25 mg by mouth 2 (two) times daily.       .  Multiple Vitamin (MULTIVITAMIN WITH MINERALS) TABS Take 1 tablet by mouth daily.      . sertraline (ZOLOFT) 100 MG tablet Take 150 mg by mouth daily before breakfast.       . vitamin A 10000 UNIT capsule Take 10,000 Units by mouth daily.      . vitamin C (ASCORBIC ACID) 500 MG tablet Take 500 mg by mouth daily.      . vitamin E 400 UNIT capsule Take 400 Units by mouth daily.       No current facility-administered medications for this visit.   Percocet; Sulfa drugs cross reactors; and Topamax Family History  Problem Relation Age of Onset  . Hypertension Mother   . Diabetes Mother   . Hypertension Father    Social History:   reports that he has never smoked. He does not have any smokeless tobacco history on file. He reports that he drinks about 1.2 ounces of alcohol per week. He reports that he does not use illicit drugs.   REVIEW   OF SYSTEMS - PERTINENT POSITIVES ONLY:   Physical Exam:   Blood pressure 142/88, pulse 86, temperature 97.1 F (36.2 C), temperature source Temporal, resp. rate 20, height 6' (1.829 m), weight 571 lb (259.004 kg). Body mass index is 77.42 kg/(m^2).  Gen:  WDWN WM NAD  Neurological: Alert and oriented to person, place, and time. Motor and sensory function is grossly intact  Head: Normocephalic and atraumatic.  Eyes: Conjunctivae are normal. Pupils are equal, round, and reactive to light. No scleral icterus.  Neck: Normal range of motion. Neck supple. No tracheal deviation or thyromegaly present.  Cardiovascular:  SR without murmurs or gallops.  No carotid bruits Respiratory: Effort normal.  No respiratory distress. No chest wall tenderness. Breath sounds normal.  No wheezes, rales or rhonchi.  Abdomen:  Very large and nontender GU: Musculoskeletal: Normal range of motion. Extremities are nontender. No cyanosis, edema or clubbing noted Lymphadenopathy: No cervical, preauricular, postauricular or axillary adenopathy is present Skin: Skin is warm and dry. No  rash noted. No diaphoresis. No erythema. No pallor. Pscyh: Normal mood and affect. Behavior is normal. Judgment and thought content normal.   LABORATORY RESULTS: No results found for this or any previous visit (from the past 48 hour(s)).  RADIOLOGY RESULTS: No results found.  Problem List: Patient Active Problem List  Diagnosis  . Morbid obesity with BMI of 70 and over, adult    Assessment & Plan: Super morbid obesity  Sleeve Gastrectomy  Bowel prep given.  Instructions for diet given.  Ready for surgery in 11 days.  No further questions    Matt B. Aimy Sweeting, MD, FACS  [facility] [phone] beeper [phone]  09/19/2012 4:11 PM     

## 2012-09-30 NOTE — Anesthesia Preprocedure Evaluation (Addendum)
Anesthesia Evaluation  Patient identified by MRN, date of birth, ID band Patient awake    Reviewed: Allergy & Precautions, H&P , NPO status , Patient's Chart, lab work & pertinent test results  Airway Mallampati: I TM Distance: >3 FB Neck ROM: Full    Dental  (+) Dental Advisory Given and Teeth Intact   Pulmonary sleep apnea ,  breath sounds clear to auscultation        Cardiovascular hypertension, Pt. on medications Rhythm:Regular Rate:Normal     Neuro/Psych PSYCHIATRIC DISORDERS Depression negative neurological ROS     GI/Hepatic negative GI ROS, Neg liver ROS,   Endo/Other  Morbid obesity  Renal/GU negative Renal ROS     Musculoskeletal negative musculoskeletal ROS (+)   Abdominal (+) + obese,   Peds  Hematology negative hematology ROS (+)   Anesthesia Other Findings   Reproductive/Obstetrics                          Anesthesia Physical Anesthesia Plan  ASA: III  Anesthesia Plan: General   Post-op Pain Management:    Induction: Intravenous  Airway Management Planned: Oral ETT  Additional Equipment:   Intra-op Plan:   Post-operative Plan: Extubation in OR  Informed Consent: I have reviewed the patients History and Physical, chart, labs and discussed the procedure including the risks, benefits and alternatives for the proposed anesthesia with the patient or authorized representative who has indicated his/her understanding and acceptance.   Dental advisory given  Plan Discussed with: CRNA  Anesthesia Plan Comments:         Anesthesia Quick Evaluation

## 2012-09-30 NOTE — Progress Notes (Signed)
Spoke with pt, has brought home unit CPAP machine. Checked cords--will place a call for [employer] services. Added humidification and bled in 3L 02 pt resting comfortably and tol well. VSS. RT will assist as needed.

## 2012-09-30 NOTE — Interval H&P Note (Signed)
History and Physical Interval Note:  09/30/2012 10:19 AM  [name]  has presented today for surgery, with the diagnosis of morbid obesity  The various methods of treatment have been discussed with the patient and family. After consideration of risks, benefits and other options for treatment, the patient has consented to  Procedure(s) with comments: LAPAROSCOPIC GASTRIC SLEEVE RESECTION (N/A) - Sleeve Gastrectomy ESOPHAGOGASTRODUODENOSCOPY (EGD) (N/A) as a surgical intervention .  The patient's history has been reviewed, patient examined, no change in status, stable for surgery.  I have reviewed the patient's chart and labs.  Questions were answered to the patient's satisfaction.     Jason Berry

## 2012-09-30 NOTE — Transfer of Care (Signed)
Immediate Anesthesia Transfer of Care Note  Patient: Jason Berry  Procedure(s) Performed: Procedure(s) with comments: LAPAROSCOPIC GASTRIC SLEEVE RESECTION (N/A) - Sleeve Gastrectomy ESOPHAGOGASTRODUODENOSCOPY (EGD) (N/A)  Patient Location: PACU  Anesthesia Type:General  Level of Consciousness: awake, alert , oriented and patient cooperative  Airway & Oxygen Therapy: Patient Spontanous Breathing and Patient connected to face mask oxygen  Post-op Assessment: Report given to PACU RN and Post -op Vital signs reviewed and stable  Post vital signs: stable  Complications: No apparent anesthesia complications

## 2012-10-01 ENCOUNTER — Inpatient Hospital Stay (HOSPITAL_COMMUNITY): Payer: PRIVATE HEALTH INSURANCE

## 2012-10-01 ENCOUNTER — Encounter (HOSPITAL_COMMUNITY): Payer: Self-pay | Admitting: Surgery

## 2012-10-01 DIAGNOSIS — Z09 Encounter for follow-up examination after completed treatment for conditions other than malignant neoplasm: Secondary | ICD-10-CM

## 2012-10-01 LAB — CBC WITH DIFFERENTIAL/PLATELET
Basophils Absolute: 0 10*3/uL (ref 0.0–0.1)
Eosinophils Relative: 0 % (ref 0–5)
HCT: 41.8 % (ref 39.0–52.0)
Lymphocytes Relative: 29 % (ref 12–46)
Lymphs Abs: 2.7 10*3/uL (ref 0.7–4.0)
MCV: 88 fL (ref 78.0–100.0)
Monocytes Absolute: 1 10*3/uL (ref 0.1–1.0)
RDW: 14.2 % (ref 11.5–15.5)
WBC: 9.1 10*3/uL (ref 4.0–10.5)

## 2012-10-01 MED ORDER — IOHEXOL 300 MG/ML  SOLN
100.0000 mL | Freq: Once | INTRAMUSCULAR | Status: AC | PRN
  Administered 2012-10-01: 100 mL via ORAL

## 2012-10-01 NOTE — Progress Notes (Signed)
*  PRELIMINARY RESULTS* Vascular Ultrasound Lower extremity venous duplex has been completed.  Preliminary findings: Bilateral:  No evidence of DVT in visualized veins. Cannot clearly visualize distal femoral veins and calf veins bilaterally due to body habitus.    Farrel Demark, RDMS, RVT  10/01/2012, 8:41 AM

## 2012-10-01 NOTE — Progress Notes (Signed)
Patient ID: Jason Berry, male   DOB: 1966-04-01, 47 y.o.   MRN: [id number] [facility] Progress Note:   1 Day Post-Op  Subjective: Mental status is clear Objective: Vital signs in last 24 hours: Temp:  [98.3 F (36.8 C)-98.9 F (37.2 C)] 98.6 F (37 C) (04/29 0800) Pulse Rate:  [70-87] 79 (04/29 1152) Resp:  [12-20] 17 (04/29 1152) BP: (136-189)/(74-121) 177/93 mmHg (04/29 1152) SpO2:  [89 %-100 %] 89 % (04/29 1152) Weight:  [564 lb 13.1 oz (256.2 kg)] 564 lb 13.1 oz (256.2 kg) (04/28 1700)  Intake/Output from previous day: 04/28 0701 - 04/29 0700 In: 3905 [I.V.:3905] Out: 1315 [Urine:1020; Drains:90; Blood:100] Intake/Output this shift: Total I/O In: 500 [I.V.:500] Out: 220 [Urine:200; Drains:20]  Physical Exam: Work of breathing is normal.  JP drainage serosanguinous.  Minimal pain.  Swallow ok.   Lab Results:  Results for orders placed during the hospital encounter of 09/30/12 (from the past 48 hour(s))  CBC     Status: Abnormal   Collection Time    09/30/12  3:26 PM      Result Value Range   WBC 13.5 (*) 4.0 - 10.5 K/uL   RBC 5.14  4.22 - 5.81 MIL/uL   Hemoglobin 14.8  13.0 - 17.0 g/dL   HCT 08.6  57.8 - 46.9 %   MCV 87.5  78.0 - 100.0 fL   MCH 28.8  26.0 - 34.0 pg   MCHC 32.9  30.0 - 36.0 g/dL   RDW 62.9  52.8 - 41.3 %   Platelets 224  150 - 400 K/uL  CREATININE, SERUM     Status: None   Collection Time    09/30/12  3:26 PM      Result Value Range   Creatinine, Ser 0.93  0.50 - 1.35 mg/dL   GFR calc non Af Amer >90  >90 mL/min   GFR calc Af Amer >90  >90 mL/min   Comment:            The eGFR has been calculated     using the CKD EPI equation.     This calculation has not been     validated in all clinical     situations.     eGFR's persistently     <90 mL/min signify     possible Chronic Kidney Disease.  CBC WITH DIFFERENTIAL     Status: None   Collection Time    10/01/12  3:40 AM      Result Value Range   WBC 9.1  4.0 - 10.5 K/uL   RBC  4.75  4.22 - 5.81 MIL/uL   Hemoglobin 13.9  13.0 - 17.0 g/dL   HCT 24.4  01.0 - 27.2 %   MCV 88.0  78.0 - 100.0 fL   MCH 29.3  26.0 - 34.0 pg   MCHC 33.3  30.0 - 36.0 g/dL   RDW 53.6  64.4 - 03.4 %   Platelets 233  150 - 400 K/uL   Neutrophils Relative 59  43 - 77 %   Neutro Abs 5.3  1.7 - 7.7 K/uL   Lymphocytes Relative 29  12 - 46 %   Lymphs Abs 2.7  0.7 - 4.0 K/uL   Monocytes Relative 11  3 - 12 %   Monocytes Absolute 1.0  0.1 - 1.0 K/uL   Eosinophils Relative 0  0 - 5 %   Eosinophils Absolute 0.0  0.0 - 0.7 K/uL   Basophils Relative 0  0 - 1 %   Basophils Absolute 0.0  0.0 - 0.1 K/uL    Radiology/Results: Dg Ugi W/water Sol Cm  10/01/2012  *RADIOLOGY REPORT*  Clinical Data:Post gastric sleeve procedure.  ESOPHAGUS/BARIUM SWALLOW/TABLET STUDY  Contrast:  100 ml Omnipaque-300.  Fluoroscopy Time: 37 seconds.  Comparison: None.  Findings: The present examination is limited in that the patient exceeded the fluoroscopic table weight limits, had difficulty standing and barely fit within the fluoroscopic gantry during limited standing the patient was able to perform.  On this limited examination, there is no holdup of ingested Omnipaque.  No obvious leak identified.  IMPRESSION: On this limited examination, there is no holdup of ingested Omnipaque.  No obvious leak identified.   Original Report Authenticated By: Lacy Duverney, M.D.     Anti-infectives: Anti-infectives   Start     Dose/Rate Route Frequency Ordered Stop   09/30/12 0750  cefOXitin (MEFOXIN) 2 g in dextrose 5 % 50 mL IVPB  Status:  Discontinued     2 g 100 mL/hr over 30 Minutes Intravenous On call to O.R. 09/30/12 0750 09/30/12 1532      Assessment/Plan: Problem List: Patient Active Problem List   Diagnosis Date Noted  . Morbid obesity with BMI of 70 and over, adult 08/23/2012    Will advance to PD 1 diet and transfer to floor.   1 Day Post-Op    LOS: 1 day   Matt B. Daphine Deutscher, MD, [facility] [phone] beeper [phone]  10/01/2012 1:26 PM

## 2012-10-01 NOTE — Progress Notes (Signed)
CARE MANAGEMENT NOTE 10/01/2012  Patient:  Jason Berry,Jason Berry   Account Number:  [id number]  Date Initiated:  10/01/2012  Documentation initiated by:  DAVIS,RHONDA  Subjective/Objective Assessment:   s/p gastric sleeve, pt is over 550lbs, hypertensive post op,     Action/Plan:   home   Anticipated DC Date:  10/04/2012   Anticipated DC Plan:  HOME/SELF CARE  In-house referral  NA      DC Planning Services  NA      PAC Choice  NA   Choice offered to / List presented to:  NA   DME arranged  NA      DME agency  NA     HH arranged  NA      HH agency  NA   Status of service:  In process, will continue to follow Medicare Important Message given?  NA - LOS <3 / Initial given by admissions (If response is "NO", the following Medicare IM given date fields will be blank) Date Medicare IM given:   Date Additional Medicare IM given:    Discharge Disposition:    Per UR Regulation:  Reviewed for med. necessity/level of care/duration of stay  If discussed at Long Length of Stay Meetings, dates discussed:    Comments:  04292014/Rhonda Davis, RN, BSN, CCM:  CHART REVIEWED AND UPDATED.  Next chart review due on 05022014. NO DISCHARGE NEEDS PRESENT AT THIS TIME. CASE MANAGEMENT [phone]  

## 2012-10-01 NOTE — Anesthesia Postprocedure Evaluation (Signed)
Anesthesia Post Note  Patient: Jason Berry  Procedure(s) Performed: Procedure(s) (LRB): LAPAROSCOPIC GASTRIC SLEEVE RESECTION (N/A) ESOPHAGOGASTRODUODENOSCOPY (EGD) (N/A)  Anesthesia type: General  Patient location: PACU  Post pain: Pain level controlled  Post assessment: Post-op Vital signs reviewed  Last Vitals: BP 166/90  Pulse 85  Temp(Src) 37.1 C (Oral)  Resp 16  Ht 5\' 11"  (1.803 m)  Wt 564 lb 13.1 oz (256.2 kg)  BMI 78.81 kg/m2  SpO2 96%  Post vital signs: Reviewed  Level of consciousness: sedated  Complications: No apparent anesthesia complications

## 2012-10-02 LAB — CBC WITH DIFFERENTIAL/PLATELET
Eosinophils Absolute: 0.2 10*3/uL (ref 0.0–0.7)
Eosinophils Relative: 2 % (ref 0–5)
HCT: 42.2 % (ref 39.0–52.0)
Lymphocytes Relative: 34 % (ref 12–46)
Lymphs Abs: 2.9 10*3/uL (ref 0.7–4.0)
MCH: 29.5 pg (ref 26.0–34.0)
MCV: 87.2 fL (ref 78.0–100.0)
Monocytes Absolute: 0.9 10*3/uL (ref 0.1–1.0)
Monocytes Relative: 11 % (ref 3–12)
Platelets: 215 10*3/uL (ref 150–400)
RBC: 4.84 MIL/uL (ref 4.22–5.81)
WBC: 8.6 10*3/uL (ref 4.0–10.5)

## 2012-10-02 MED ORDER — HYDROCODONE-ACETAMINOPHEN 7.5-325 MG/15ML PO SOLN: 10.0000 mL | ORAL | Status: DC | PRN

## 2012-10-02 MED ORDER — KCL IN DEXTROSE-NACL 20-5-0.45 MEQ/L-%-% IV SOLN
INTRAVENOUS | Status: AC
  Administered 2012-10-03: via INTRAVENOUS
  Filled 2012-10-02: qty 1000

## 2012-10-02 MED ORDER — LORATADINE 10 MG PO TABS
10.0000 mg | ORAL_TABLET | Freq: Two times a day (BID) | ORAL | Status: DC
  Filled 2012-10-02 (×3): qty 1

## 2012-10-02 MED ORDER — LISINOPRIL 40 MG PO TABS
40.0000 mg | ORAL_TABLET | Freq: Two times a day (BID) | ORAL | Status: DC
  Administered 2012-10-02 – 2012-10-03 (×3): 40 mg via ORAL
  Filled 2012-10-02 (×5): qty 1

## 2012-10-02 MED ORDER — SERTRALINE HCL 100 MG PO TABS
150.0000 mg | ORAL_TABLET | Freq: Every day | ORAL | Status: DC
  Administered 2012-10-02 – 2012-10-03 (×2): 150 mg via ORAL
  Filled 2012-10-02 (×2): qty 1

## 2012-10-02 MED ORDER — MORPHINE SULFATE 4 MG/ML IJ SOLN
INTRAMUSCULAR | Status: AC
  Administered 2012-10-02: 4 mg
  Filled 2012-10-02: qty 1

## 2012-10-02 MED ORDER — FUROSEMIDE 20 MG PO TABS
20.0000 mg | ORAL_TABLET | Freq: Two times a day (BID) | ORAL | Status: DC
  Administered 2012-10-02 – 2012-10-03 (×3): 20 mg via ORAL
  Filled 2012-10-02 (×5): qty 1

## 2012-10-02 MED ORDER — METOPROLOL TARTRATE 25 MG PO TABS
25.0000 mg | ORAL_TABLET | Freq: Two times a day (BID) | ORAL | Status: DC
  Administered 2012-10-02 – 2012-10-03 (×3): 25 mg via ORAL
  Filled 2012-10-02 (×4): qty 1

## 2012-10-02 MED ORDER — KCL IN DEXTROSE-NACL 20-5-0.45 MEQ/L-%-% IV SOLN
INTRAVENOUS | Status: DC
  Administered 2012-10-02 (×2): via INTRAVENOUS
  Filled 2012-10-02 (×2): qty 1000

## 2012-10-02 MED ORDER — LORATADINE 10 MG PO TABS
10.0000 mg | ORAL_TABLET | Freq: Every day | ORAL | Status: DC
  Administered 2012-10-02 – 2012-10-03 (×2): 10 mg via ORAL
  Filled 2012-10-02 (×2): qty 1

## 2012-10-02 NOTE — Progress Notes (Signed)
Dr Zola Button was notified and ordered to In and Out cath patient.  Since patient had declined it call Dr Zola Button said to call Dr Daphine Deutscher.  Dr Daphine Deutscher was notified and said that he will come to assess patient.  Previous to that patient planned to get up at 0600 to void but remained in bed sleeping.

## 2012-10-02 NOTE — Progress Notes (Signed)
Patient ID: Jason Berry, male   DOB: [birth year], 47 y.o.   MRN: [id number] [facility] Surgery Progress Note:   2 Days Post-Op  Subjective: Mental status is clear.  CPAP in place.  Having difficulty voiding Objective: Vital signs in last 24 hours: Temp:  [98.1 F (36.7 C)-99.5 F (37.5 C)] 98.1 F (36.7 C) (04/30 1000) Pulse Rate:  [72-81] 72 (04/30 1000) Resp:  [17-20] 18 (04/30 1000) BP: (163-195)/(81-93) 195/93 mmHg (04/30 1000) SpO2:  [89 %-95 %] 95 % (04/30 1000)  Intake/Output from previous day: 04/29 0701 - 04/30 0700 In: 2323.7 [P.O.:2; I.V.:2321.7] Out: 420 [Urine:300; Drains:120] Intake/Output this shift: Total I/O In: 80 [P.O.:80] Out: 10 [Drains:10]  Physical Exam: Work of breathing is treated with CPAP.  JP serous.  No abdominal pain.  No nausea.  Back on home meds  Lab Results:  Results for orders placed during the hospital encounter of 09/30/12 (from the past 48 hour(s))  CBC     Status: Abnormal   Collection Time    09/30/12  3:26 PM      Result Value Range   WBC 13.5 (*) 4.0 - 10.5 K/uL   RBC 5.14  4.22 - 5.81 MIL/uL   Hemoglobin 14.8  13.0 - 17.0 g/dL   HCT 40.9  81.1 - 91.4 %   MCV 87.5  78.0 - 100.0 fL   MCH 28.8  26.0 - 34.0 pg   MCHC 32.9  30.0 - 36.0 g/dL   RDW 78.2  95.6 - 21.3 %   Platelets 224  150 - 400 K/uL  CREATININE, SERUM     Status: None   Collection Time    09/30/12  3:26 PM      Result Value Range   Creatinine, Ser 0.93  0.50 - 1.35 mg/dL   GFR calc non Af Amer >90  >90 mL/min   GFR calc Af Amer >90  >90 mL/min   Comment:            The eGFR has been calculated     using the CKD EPI equation.     This calculation has not been     validated in all clinical     situations.     eGFR's persistently     <90 mL/min signify     possible Chronic Kidney Disease.  CBC WITH DIFFERENTIAL     Status: None   Collection Time    10/01/12  3:40 AM      Result Value Range   WBC 9.1  4.0 - 10.5 K/uL   RBC 4.75  4.22 - 5.81 MIL/uL   Hemoglobin 13.9  13.0 - 17.0 g/dL   HCT 08.6  57.8 - 46.9 %   MCV 88.0  78.0 - 100.0 fL   MCH 29.3  26.0 - 34.0 pg   MCHC 33.3  30.0 - 36.0 g/dL   RDW 62.9  52.8 - 41.3 %   Platelets 233  150 - 400 K/uL   Neutrophils Relative 59  43 - 77 %   Neutro Abs 5.3  1.7 - 7.7 K/uL   Lymphocytes Relative 29  12 - 46 %   Lymphs Abs 2.7  0.7 - 4.0 K/uL   Monocytes Relative 11  3 - 12 %   Monocytes Absolute 1.0  0.1 - 1.0 K/uL   Eosinophils Relative 0  0 - 5 %   Eosinophils Absolute 0.0  0.0 - 0.7 K/uL   Basophils Relative 0  0 - 1 %  Basophils Absolute 0.0  0.0 - 0.1 K/uL  HEMOGLOBIN AND HEMATOCRIT, BLOOD     Status: None   Collection Time    10/01/12  3:51 PM      Result Value Range   Hemoglobin 14.1  13.0 - 17.0 g/dL   HCT 16.1  09.6 - 04.5 %  CBC WITH DIFFERENTIAL     Status: None   Collection Time    10/02/12  5:10 AM      Result Value Range   WBC 8.6  4.0 - 10.5 K/uL   RBC 4.84  4.22 - 5.81 MIL/uL   Hemoglobin 14.3  13.0 - 17.0 g/dL   HCT 40.9  81.1 - 91.4 %   MCV 87.2  78.0 - 100.0 fL   MCH 29.5  26.0 - 34.0 pg   MCHC 33.9  30.0 - 36.0 g/dL   RDW 78.2  95.6 - 21.3 %   Platelets 215  150 - 400 K/uL   Neutrophils Relative 54  43 - 77 %   Neutro Abs 4.6  1.7 - 7.7 K/uL   Lymphocytes Relative 34  12 - 46 %   Lymphs Abs 2.9  0.7 - 4.0 K/uL   Monocytes Relative 11  3 - 12 %   Monocytes Absolute 0.9  0.1 - 1.0 K/uL   Eosinophils Relative 2  0 - 5 %   Eosinophils Absolute 0.2  0.0 - 0.7 K/uL   Basophils Relative 0  0 - 1 %   Basophils Absolute 0.0  0.0 - 0.1 K/uL    Radiology/Results: Dg Ugi W/water Sol Cm  10/01/2012  *RADIOLOGY REPORT*  Clinical Data:Post gastric sleeve procedure.  ESOPHAGUS/BARIUM SWALLOW/TABLET STUDY  Contrast:  100 ml Omnipaque-300.  Fluoroscopy Time: 37 seconds.  Comparison: None.  Findings: The present examination is limited in that the patient exceeded the fluoroscopic table weight limits, had difficulty standing and barely fit within the fluoroscopic  gantry during limited standing the patient was able to perform.  On this limited examination, there is no holdup of ingested Omnipaque.  No obvious leak identified.  IMPRESSION: On this limited examination, there is no holdup of ingested Omnipaque.  No obvious leak identified.   Original Report Authenticated By: Lacy Duverney, M.D.     Anti-infectives: Anti-infectives   Start     Dose/Rate Route Frequency Ordered Stop   09/30/12 0750  cefOXitin (MEFOXIN) 2 g in dextrose 5 % 50 mL IVPB  Status:  Discontinued     2 g 100 mL/hr over 30 Minutes Intravenous On call to O.R. 09/30/12 0750 09/30/12 1532      Assessment/Plan: Problem List: Patient Active Problem List   Diagnosis Date Noted  . Morbid obesity with BMI of 70 and over, adult 08/23/2012    May be dry.  Increase IV fluids. Advance to full liquids 2 Days Post-Op    LOS: 2 days   Matt B. Daphine Deutscher, MD, [facility] [phone] beeper [phone]  10/02/2012 10:57 AM

## 2012-10-02 NOTE — Progress Notes (Signed)
Patient experienced difficulty to urinate.  Patient was encouraged to get out of bed to the bathroom to try to void.  Patient sat on the toilet for about 30 minutes and it was unsuccessful.  Patient reported to the nurse that he does void often at night and during  The day he only voids a few times once or twice.  Patient declined In and Out cath. He said that he does not feel comfortable going through that.

## 2012-10-03 MED ORDER — ENOXAPARIN (LOVENOX) PATIENT EDUCATION KIT: PACK | Freq: Once | Status: DC

## 2012-10-03 MED ORDER — ENOXAPARIN (LOVENOX) PATIENT EDUCATION KIT
PACK | Freq: Once | Status: AC
  Administered 2012-10-03: 10:00:00
  Filled 2012-10-03: qty 1

## 2012-10-03 NOTE — Discharge Summary (Signed)
Physician Discharge Summary  Patient ID: Jason Berry MRN: [id number] DOB/AGE: June 09, 1965 47 y.o.  Admit date: 09/30/2012 Discharge date: 10/03/2012  Admission Diagnoses:  Severe morbid obesity, BMI > 70  Discharge Diagnoses:  Same, post sleeve gastrectomy  Active Problems:   * No active hospital problems. *   Surgery:  Laparoscopic sleeve gastrectomy  Discharged Condition: improved  Hospital Course:   Had surgery.  Kept in stepdown overnight.  JP serosanguinous.  UGI OK.  Tolerated liquids.  Nausea resolved.  JP removed prior to discharge  Consults: none  Significant Diagnostic Studies: UIG    Discharge Exam: Blood pressure 156/77, pulse 68, temperature 98.8 F (37.1 C), temperature source Oral, resp. rate 18, height 5\' 11"  (1.803 m), weight 564 lb 13.1 oz (256.2 kg), SpO2 96.00%. No abdominal pain.  JP serosanguinous;  Plan discharge on two weeks of Lovenox to prevent PE.    Disposition:   Discharge Orders   Future Appointments Provider Department Dept Phone   10/15/2012 4:00 PM [facility] Post-Op Class [name] [phone]   10/18/2012 10:40 AM Valarie Merino, MD [facility], [place] [phone]   Future Orders Complete By Expires     Call MD for:  persistant nausea and vomiting  As directed     Call MD for:  severe uncontrolled pain  As directed     Call MD for:  temperature >100.4  As directed     Discharge instructions  As directed     Comments:      Bariatric diet as given    Increase activity slowly  As directed     No wound care  As directed         Medication List    TAKE these medications       allopurinol 300 MG tablet  Commonly known as:  ZYLOPRIM  Take 300 mg by mouth daily.     cetirizine 10 MG tablet  Commonly known as:  ZYRTEC  Take 10 mg by mouth 2 (two) times daily.     ECHINACEA PO  Take 1 tablet by mouth as directed. Takes for 21 days and stops for 21 days( not taking at this time)      fish oil-omega-3 fatty acids 1000 MG capsule  Take 1 g by mouth daily.     furosemide 20 MG tablet  Commonly known as:  LASIX  Take 20 mg by mouth 2 (two) times daily.     HYDROcodone-acetaminophen 5-500 MG per tablet  Commonly known as:  VICODIN  Take 1 tablet by mouth every 6 (six) hours as needed for pain.     lisinopril 40 MG tablet  Commonly known as:  PRINIVIL,ZESTRIL  Take 40 mg by mouth 2 (two) times daily.     lovastatin 20 MG tablet  Commonly known as:  MEVACOR  Take 20 mg by mouth at bedtime.     metFORMIN 500 MG tablet  Commonly known as:  GLUCOPHAGE  Take 500 mg by mouth daily with breakfast.     metoprolol tartrate 25 MG tablet  Commonly known as:  LOPRESSOR  Take 25 mg by mouth 2 (two) times daily.     multivitamin with minerals Tabs  Take 1 tablet by mouth daily.     sertraline 100 MG tablet  Commonly known as:  ZOLOFT  Take 150 mg by mouth daily before breakfast.     vitamin A 65784 UNIT capsule  Take 10,000 Units by mouth daily.  vitamin C 500 MG tablet  Commonly known as:  ASCORBIC ACID  Take 500 mg by mouth daily.     vitamin E 400 UNIT capsule  Take 400 Units by mouth daily.           Follow-up Information   Follow up with Valarie Merino, MD.   Contact information:   [address] [place] [address] [phone]       Signed: Valarie Merino 10/03/2012, 8:20 AM

## 2012-10-03 NOTE — Progress Notes (Signed)
Patient alert and oriented, pain is controlled. Patient is tolerating fluids, plan to advance to protein shake today.  Reviewed Gastric sleeve discharge instructions with patient and patient is able to articulate understanding.  GASTRIC BYPASS / SLEEVE  Home Care Instructions  These instructions are to help you care for yourself when you go home.  Call: If you have any problems.   Call [phone] and ask for the surgeon on call   If you need immediate assistance come to the ER at [facility]. Tell the ER staff that you are a new post-op gastric bypass or gastric sleeve patient   Signs and symptoms to report:   Severe vomiting or nausea o If you cannot handle clear liquids for longer than 1 day, call your surgeon    Abdominal pain which does not get better after taking your pain medication   Fever greater than 100.4 F and chills   Heart rate over 100 beats a minute   Trouble breathing   Chest pain    Redness, swelling, drainage, or foul odor at incision (surgical) sites    If your incisions open or pull apart   Swelling or pain in calf (lower leg)   Diarrhea (Loose bowel movements that happen often), frequent watery, uncontrolled bowel movements   Constipation, (no bowel movements for 3 days) if this happens:  o Take Milk of Magnesia, 2 tablespoons by mouth, 3 times a day for 2 days if needed o Stop taking Milk of Magnesia once you have had a bowel movement o Call your doctor if constipation continues Or o Take Miralax  (instead of Milk of Magnesia) following the label instructions o Stop taking Miralax once you have had a bowel movement o Call your doctor if constipation continues   Anything you think is "abnormal for you"   Normal side effects after surgery:   Unable to sleep at night or unable to concentrate   Irritability   Being tearful (crying) or depressed These are common complaints, possibly related to your anesthesia, stress of surgery and change in lifestyle, that  usually go away a few weeks after surgery.  If these feelings continue, call your medical doctor.  Wound Care: You may have surgical glue, steri-strips, or staples over your incisions after surgery   Surgical glue:  Looks like a clear film over your incisions and will wear off a little at a time   Steri-strips : Adhesive strips of tape over your incisions. You may notice a yellowish color on the skin under the steri-strips. This is used to make the   steri-strips stick better. Do not pull the steri-strips off - let them fall off   Staples: Staples may be removed before you leave the hospital o If you go home with staples, call [facility] at for an appointment with your surgeon's nurse to have staples removed 10 days after surgery, [phone]   Showering: You may shower two (2) days after your surgery unless your surgeon tells you differently o Wash gently around incisions with warm soapy water, rinse well, and gently pat dry  o If you have a drain (tube from your incision), you may need someone to hold this while you shower  o No tub baths until staples are removed and incisions are healed     Medications:   Medications should be liquid or crushed if larger than the size of a dime   Extended release pills (medication that releases a little bit at a time   through the day) should not be crushed   Depending on the size and number of medications you take, you may need to space (take a few throughout the day)/change the time you take your medications so that you do not over-fill your pouch (smaller stomach)   Make sure you follow-up with your primary care physician to make medication changes needed during rapid weight loss and life-style changes   If you have diabetes, follow up with the doctor that orders your diabetes medication(s) within one week after surgery and check your blood sugar regularly.   Do not drive while taking narcotics (pain medications)   Do not take acetaminophen  (Tylenol) and Roxicet or Lortab Elixir at the same time since these pain medications contain acetaminophen  Diet:                    First 2 Weeks  You will see the nutritionist about two (2) weeks after your surgery. The nutritionist will increase the types of foods you can eat if you are handling liquids well:   If you have severe vomiting or nausea and cannot handle clear liquids lasting longer than 1 day, call your surgeon  Protein Shake   Drink at least 2 ounces of shake 5-6 times per day   Each serving of protein shakes (usually 8 - 12 ounces) should have a minimum of:  o 15 grams of protein  o And no more than 5 grams of carbohydrate    Goal for protein each day: o Men = 80 grams per day o Women = 60 grams per day   Protein powder may be added to fluids such as non-fat milk or Lactaid milk or Soy milk (limit to 35 grams added protein powder per serving)  Hydration   Slowly increase the amount of water and other clear liquids as tolerated (See Acceptable Fluids)   Slowly increase the amount of protein shake as tolerated     Sip fluids slowly and throughout the day   May use sugar substitutes in small amounts (no more than 6 - 8 packets per day; i.e. Splenda)  Fluid Goal   The first goal is to drink at least 8 ounces of protein shake/drink per day (or as directed by the nutritionist); some examples of protein shakes are Syntrax Nectar, Adkins Advantage, EAS Edge HP, and Unjury. See handout from pre-op Bariatric Education Class: o Slowly increase the amount of protein shake you drink as tolerated o You may find it easier to slowly sip shakes throughout the day o It is important to get your proteins in first   Your fluid goal is to drink 64 - 100 ounces of fluid daily o It may take a few weeks to build up to this   32 oz (or more) should be clear liquids  And    32 oz (or more) should be full liquids (see below for examples)   Liquids should not contain sugar, caffeine, or  carbonation  Clear Liquids:   Water or Sugar-free flavored water (i.e. Fruit H2O, Propel)   Decaffeinated coffee or tea (sugar-free)   Crystal Lite, Wyler's Lite, Minute Maid Lite   Sugar-free Jell-O   Bouillon or broth   Sugar-free Popsicle:   *Less than 20 calories each; Limit 1 per day  Full Liquids: Protein Shakes/Drinks + 2 choices per day of other full liquids   Full liquids must be: o No More Than 12 grams of Carbs per serving  o No   More Than 3 grams of Fat per serving   Strained low-fat cream soup   Non-Fat milk   Fat-free Lactaid Milk   Sugar-free yogurt (Dannon Lite & Fit, Greek yogurt)      Vitamins and Minerals   Start 1 day after surgery unless otherwise directed by your surgeon   2 Chewable Multivitamin / Multimineral Supplement with iron (i.e. Centrum for Adults)   Vitamin B-12, 350 - 500 micrograms sub-lingual (place tablet under the tongue) each day   Chewable Calcium Citrate with Vitamin D-3 (Example: 3 Chewable Calcium Plus 600 with Vitamin D-3) o Take 500 mg three (3) times a day for a total of 1500 mg each day o Do not take all 3 doses of calcium at one time as it may cause constipation, and you can only absorb 500 mg  at a time  o Do not mix multivitamins containing iron with calcium supplements; take 2 hours apart o Do not substitute Tums (calcium carbonate) for your calcium   Menstruating women and those at risk for anemia (a blood disease that causes weakness) may need extra iron o Talk with your doctor to see if you need more iron   If you need extra iron: Total daily Iron recommendation (including Vitamins) is 50 to 100 mg Iron/day   Do not stop taking or change any vitamins or minerals until you talk to your nutritionist or surgeon   Your nutritionist and/or surgeon must approve all vitamin and mineral supplements   Activity and Exercise: It is important to continue walking at home.  Limit your physical activity as instructed by your doctor.  During  this time, use these guidelines:   Do not lift anything greater than ten (10) pounds for at least two (2) weeks   Do not go back to work or drive until your surgeon says you can   You may have sex when you feel comfortable  o It is VERY important for male patients to use a reliable birth control method; fertility often increases after surgery  o Do not get pregnant for at least 18 months   Start exercising as soon as your doctor tells you that you can o Make sure your doctor approves any physical activity   Start with a simple walking program   Walk 5-15 minutes each day, 7 days per week.    Slowly increase until you are walking 30-45 minutes per day Consider joining our BELT program. [phone] or email [email or web address]   Special Instructions Things to remember:   Free counseling is available for you and your family through collaboration between [facility] and [employer]. Please call [phone] and leave a message   Use your CPAP when sleeping if this applies to you    Consider buying a medical alert bracelet that says you had lap-band surgery    You will likely have your first fill (fluid added to your band) 6 - 8 weeks after surgery   [facility] has a free Bariatric Surgery Support Group that meets monthly, the 3rd Thursday, 6 pm,  Education Center Classrooms You can see classes online at [email or web address]   It is very important to keep all follow up appointments with your surgeon, nutritionist, primary care physician, and behavioral health practitioner o After the first year, please follow up with your bariatric surgeon and nutritionist at least once a year in order to maintain best weight loss results [facility]: [phone] [facility]: [phone]   Bariatric Nurse Coordinator: [phone]     

## 2012-10-04 ENCOUNTER — Telehealth (INDEPENDENT_AMBULATORY_CARE_PROVIDER_SITE_OTHER): Payer: Self-pay | Admitting: General Surgery

## 2012-10-04 NOTE — Telephone Encounter (Signed)
He called saying he could not afford many of the lovenox shots.  He purchased 3 but said that he is back to his preop activity levels and denies any pain.  I recommended that he continue the lovenox for the complete two weeks and that he continue with the ambulation and activity in order to minimize the risk of DVT and PE.

## 2012-10-15 ENCOUNTER — Encounter: Payer: PRIVATE HEALTH INSURANCE | Attending: Surgery | Admitting: *Deleted

## 2012-10-15 ENCOUNTER — Encounter: Payer: Self-pay | Admitting: *Deleted

## 2012-10-15 VITALS — Ht 71.5 in | Wt >= 6400 oz

## 2012-10-15 DIAGNOSIS — Z6841 Body Mass Index (BMI) 40.0 and over, adult: Secondary | ICD-10-CM

## 2012-10-15 DIAGNOSIS — Z01818 Encounter for other preprocedural examination: Secondary | ICD-10-CM | POA: Insufficient documentation

## 2012-10-15 DIAGNOSIS — Z713 Dietary counseling and surveillance: Secondary | ICD-10-CM | POA: Insufficient documentation

## 2012-10-15 NOTE — Progress Notes (Signed)
  Bariatric Class:  Appt start time: 1600 end time:  1700.  2 Week Post-Operative Nutrition Class  Patient was seen on 10/15/2012 for Post-Operative Nutrition [facility].   Surgery date: 09/30/12  Surgery type: Sleeve  [facility] weight at [facility]: 581 lbs (04/19/11)  Weight today: 516.5 lbs  Weight change: 51.5 lbs  Total weight lost: 64.5 lbs  [facility] BODY COMP RESULTS   04/19/12  05/21/12  06/19/12  07/31/12  09/19/12  10/15/12  BMI (kg/m^2)  75.5  74.8  76.4  76.7  78.1  71.0  Fat Mass (lbs)  326.0  319.0  291.5  --  320.5  367.5  Fat Free Mass (lbs)  223.0  225.0  264.0  --  247.5  149.0  Total Body Water (lbs)  163.0  164.5  193.5  --  181.0  109.0   The following the learning objectives were met by the patient during this course:  Identifies Phase 3A (Soft, High Proteins) Dietary Goals and will begin from 2 weeks post-operatively to 2 months post-operatively  Identifies appropriate sources of fluids and proteins   States protein recommendations and appropriate sources post-operatively  Identifies the need for appropriate texture modifications, mastication, and bite sizes when consuming solids  Identifies appropriate multivitamin and calcium sources post-operatively  Describes the need for physical activity post-operatively and will follow MD recommendations  States when to call healthcare provider regarding medication questions or post-operative complications  Handouts given during class include:  Phase 3A: Soft, High Protein Diet Handout  Follow-Up Plan: Patient will follow-up at [facility] in 6 weeks for 2 months post-op nutrition visit for diet advancement per MD.

## 2012-10-15 NOTE — Patient Instructions (Signed)
Patient to follow Phase 3A-Soft, High Protein Diet and follow-up at [facility] in 6 weeks for 2 months post-op nutrition visit for diet advancement. 

## 2012-10-18 ENCOUNTER — Encounter (INDEPENDENT_AMBULATORY_CARE_PROVIDER_SITE_OTHER): Payer: Self-pay | Admitting: Surgery

## 2012-10-18 ENCOUNTER — Ambulatory Visit (INDEPENDENT_AMBULATORY_CARE_PROVIDER_SITE_OTHER): Payer: PRIVATE HEALTH INSURANCE | Admitting: Surgery

## 2012-10-18 VITALS — BP 130/66 | HR 63 | Temp 96.0°F | Ht 72.0 in | Wt >= 6400 oz

## 2012-10-18 DIAGNOSIS — Z9889 Other specified postprocedural states: Secondary | ICD-10-CM

## 2012-10-18 DIAGNOSIS — Z6841 Body Mass Index (BMI) 40.0 and over, adult: Secondary | ICD-10-CM

## 2012-10-18 DIAGNOSIS — Z903 Acquired absence of stomach [part of]: Secondary | ICD-10-CM

## 2012-10-18 NOTE — Progress Notes (Signed)
Jason Berry 47 y.o.  Body mass index is 70.08 kg/(m^2).  Patient Active Problem List   Diagnosis Date Noted  . Morbid obesity with BMI of 70 and over, adult 08/23/2012    Allergies  Allergen Reactions  . Ibuprofen Other (See Comments)    'Kidney problems"  . Percocet (Oxycodone-Acetaminophen)     itching  . Sulfa Drugs Cross Reactors   . Topamax (Topiramate)     Causes kidney failure    Past Surgical History  Procedure Laterality Date  . Knee surgery      bilateral  . Laparoscopic gastric sleeve resection N/A 09/30/2012    Procedure: LAPAROSCOPIC GASTRIC SLEEVE RESECTION;  Surgeon: Valarie Merino, MD;  Location: [facility];  Service: General;  Laterality: N/A;  Sleeve Gastrectomy  . Esophagogastroduodenoscopy N/A 09/30/2012    Procedure: ESOPHAGOGASTRODUODENOSCOPY (EGD);  Surgeon: Valarie Merino, MD;  Location: [facility];  Service: General;  Laterality: N/A;   Jason Bangs, MD No diagnosis found.  Two weeks and 4 days since surgery.  Incisions are healing very well.  Monofilament tip of suture was removed by him.  He feels he has lost 58 lbs since before surgery.  He is doing very well and is appreciative.  He was unable to afford Lovenox treatment.  No evidence of DVT or breathing problems.   Jason B. Daphine Deutscher, MD, [facility] [phone] beeper [phone]  10/18/2012 10:55 AM

## 2012-10-18 NOTE — Patient Instructions (Signed)
Thanks for your patience.  If you need further assistance after leaving the office, please call our office and speak with a [facility] nurse.  [phone].  If you want to leave a message for Dr. Jerren Flinchbaugh, please call his office phone at [phone]. 

## 2012-11-07 ENCOUNTER — Telehealth (HOSPITAL_COMMUNITY): Payer: Self-pay

## 2012-11-07 NOTE — Telephone Encounter (Signed)
Encouraged patient to continue to follow nutrition information provided at postoperative nutrition follow up, and encouraged support group attendance

## 2012-11-25 ENCOUNTER — Ambulatory Visit: Payer: PRIVATE HEALTH INSURANCE | Admitting: *Deleted

## 2012-11-26 ENCOUNTER — Ambulatory Visit: Payer: PRIVATE HEALTH INSURANCE | Admitting: *Deleted

## 2012-12-04 ENCOUNTER — Ambulatory Visit (INDEPENDENT_AMBULATORY_CARE_PROVIDER_SITE_OTHER): Payer: PRIVATE HEALTH INSURANCE | Admitting: Surgery

## 2012-12-04 ENCOUNTER — Encounter (INDEPENDENT_AMBULATORY_CARE_PROVIDER_SITE_OTHER): Payer: Self-pay | Admitting: Surgery

## 2012-12-04 VITALS — BP 138/82 | HR 72 | Temp 97.2°F | Resp 14 | Ht 71.5 in | Wt >= 6400 oz

## 2012-12-04 DIAGNOSIS — Z9889 Other specified postprocedural states: Secondary | ICD-10-CM

## 2012-12-04 DIAGNOSIS — Z903 Acquired absence of stomach [part of]: Secondary | ICD-10-CM

## 2012-12-04 NOTE — Patient Instructions (Signed)
Thanks for your patience.  If you need further assistance after leaving the office, please call our office and speak with a [facility] nurse.  [phone].  If you want to leave a message for Dr. Morris Longenecker, please call his office phone at [phone]. 

## 2012-12-04 NOTE — Progress Notes (Signed)
Jason Berry 47 y.o.  Body mass index is 68.36 kg/(m^2).  Patient Active Problem List   Diagnosis Date Noted  . Lap 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 10/18/2012  . Morbid obesity with BMI of 70 and over, adult 08/23/2012    Allergies  Allergen Reactions  . Ibuprofen Other (See Comments)    'Kidney problems"  . Percocet (Oxycodone-Acetaminophen)     itching  . Sulfa Drugs Cross Reactors   . Topamax (Topiramate)     Causes kidney failure    Past Surgical History  Procedure Laterality Date  . Knee surgery      bilateral  . Laparoscopic gastric sleeve resection N/A 09/30/2012    Procedure: LAPAROSCOPIC GASTRIC SLEEVE RESECTION;  Surgeon: Valarie Merino, MD;  Location: [facility];  Service: General;  Laterality: N/A;  Sleeve Gastrectomy  . Esophagogastroduodenoscopy N/A 09/30/2012    Procedure: ESOPHAGOGASTRODUODENOSCOPY (EGD);  Surgeon: Valarie Merino, MD;  Location: [facility];  Service: General;  Laterality: N/A;   Jason Bangs, MD No diagnosis found.  Weight down to 497 lbs.  Feeling good and better about the changes in his body habitus.  No systemic problems.  Will see back in 2 months.  Jason B. Daphine Deutscher, MD, [facility] [phone] beeper [phone]  12/04/2012 10:20 AM

## 2013-01-07 ENCOUNTER — Encounter: Payer: Self-pay | Admitting: *Deleted

## 2013-01-07 ENCOUNTER — Encounter: Payer: PRIVATE HEALTH INSURANCE | Attending: Surgery | Admitting: *Deleted

## 2013-01-07 VITALS — Ht 71.5 in | Wt >= 6400 oz

## 2013-01-07 DIAGNOSIS — Z713 Dietary counseling and surveillance: Secondary | ICD-10-CM | POA: Insufficient documentation

## 2013-01-07 DIAGNOSIS — Z01818 Encounter for other preprocedural examination: Secondary | ICD-10-CM | POA: Insufficient documentation

## 2013-01-07 NOTE — Patient Instructions (Addendum)
Goals:  Follow Phase 3B: High Protein + Non-Starchy Vegetables  Increase lean protein foods to meet 60-80g goal  Increase fluid intake to 64oz +  Add 15 grams of carbohydrate (fruit, whole grain, starchy vegetable) with meals  Avoid drinking 15 minutes before, during and 30 minutes after eating  Aim for >30 min of physical activity daily

## 2013-01-07 NOTE — Progress Notes (Signed)
Follow-up visit:  3 Months Post-Operative Sleeve Gastrectomy Surgery  Medical Nutrition Therapy:  Appt start time: 1230   End time:  1300.  Primary concerns today: Post-operative Bariatric Surgery Nutrition Management. Jason Berry returns for f/u with additional 25 lb wt loss.  Fluid fluctuating quite a bit, which he states was normal for him prior to surgery. Reports eating Tostadas and [employer] hot dogs at meals. Does not tolerate pasta.     Surgery date: 09/30/12  Surgery type: Sleeve  Start weight at [facility]: 581 lbs (04/19/11)  Weight today: 491.5 lbs  Weight change: 25.0 lbs  Total weight lost: 89.5 lbs  TANITA BODY COMP RESULTS   04/19/12  05/21/12  06/19/12  07/31/12  09/19/12  10/15/12 01/07/13  BMI (kg/m^2)  75.5  74.8  76.4  76.7  78.1  71.0 67.6  Fat Mass (lbs)  326.0  319.0  291.5  --  320.5  367.5 317.0  Fat Free Mass (lbs)  223.0  225.0  264.0  --  247.5  149.0 174.5  Total Body Water (lbs)  163.0  164.5  193.5  --  181.0  109.0 127.5  *NOTE: question accuracy of Tanita on 10/15/12.   Fluid intake: > 64 oz Estimated total protein intake: 80-100 g  Medications: No changes Supplementation: Taking regularly  Using straws: No Drinking while eating: No; baby sips when needed Hair loss: No Carbonated beverages: No N/V/D/C:  Occasional diarrhea; not problematic  Dumping syndrome: None  Recent physical activity:  Active with dog and increased ADLs. Should be more active as weight comes down.   Progress Towards Goal(s):  In progress.   Nutritional Diagnosis:  [place]-3.3 Overweight/obesity related to past poor dietary habits and physical inactivity as evidenced by patient w/ recent Sleeve Gastrectomy surgery following dietary guidelines for continued weight loss.    Intervention:  Nutrition education  Monitoring/Evaluation:  Dietary intake, exercise, and body weight. Follow up in 3 months for 6 month post-op visit.

## 2013-02-05 ENCOUNTER — Encounter (INDEPENDENT_AMBULATORY_CARE_PROVIDER_SITE_OTHER): Payer: Self-pay | Admitting: Surgery

## 2013-02-05 ENCOUNTER — Ambulatory Visit (INDEPENDENT_AMBULATORY_CARE_PROVIDER_SITE_OTHER): Payer: PRIVATE HEALTH INSURANCE | Admitting: Surgery

## 2013-02-05 VITALS — BP 150/94 | HR 70 | Resp 18 | Ht 71.5 in | Wt >= 6400 oz

## 2013-02-05 DIAGNOSIS — Z903 Acquired absence of stomach [part of]: Secondary | ICD-10-CM

## 2013-02-05 NOTE — Patient Instructions (Addendum)
Thanks for your patience.  If you need further assistance after leaving the office, please call our office and speak with a [facility] nurse.  [phone].  If you want to leave a message for Dr. Tanaisha Pittman, please call his office phone at [phone]. 
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
## 2013-04-10 ENCOUNTER — Other Ambulatory Visit: Payer: Self-pay

## 2013-05-16 ENCOUNTER — Encounter: Payer: Self-pay | Admitting: Dietician

## 2013-05-16 ENCOUNTER — Ambulatory Visit (INDEPENDENT_AMBULATORY_CARE_PROVIDER_SITE_OTHER): Payer: PRIVATE HEALTH INSURANCE | Admitting: Surgery

## 2013-05-16 ENCOUNTER — Encounter (INDEPENDENT_AMBULATORY_CARE_PROVIDER_SITE_OTHER): Payer: Self-pay | Admitting: Surgery

## 2013-05-16 VITALS — BP 158/96 | HR 72 | Temp 97.9°F | Resp 18 | Ht 72.0 in | Wt >= 6400 oz

## 2013-05-16 DIAGNOSIS — Z9884 Bariatric surgery status: Secondary | ICD-10-CM

## 2013-05-16 DIAGNOSIS — Z903 Acquired absence of stomach [part of]: Secondary | ICD-10-CM

## 2013-05-16 NOTE — Patient Instructions (Signed)
Sleeve Gastrectomy, Care After Refer to this sheet in the next few weeks. These instructions provide you with information on caring for yourself after your procedure. Your surgeon may also give you more specific instructions. Your treatment has been planned according to current medical practices, but problems sometimes occur. Call your surgeon if you have any problems or questions after your procedure. HOME CARE INSTRUCTIONS  Get plenty of rest, but move around frequently for short periods or take short walks as directed by your surgeon. Increase your activities gradually.  Only take over-the-counter or prescription medicines as directed by your surgeon.  Keep incision areas clean and dry. Remove or change any bandages (dressings) only as directed by your surgeon. You may have skin adhesive strips or glue over the incision areas. Do not take the strips or the glue off. They will fall off on their own.  Check your incisions and surrounding areas daily for any redness, swelling, or drainage of fluid.  Take showers once your surgeon approves. Until then, only take sponge baths. Pat incisions dry. Do not rub incisions with a washcloth or towel. Do not take tub baths or go swimming until your surgeon approves. Do not put anything on your incision to clean it unless directed to do so by your surgeon.  Limit activities as directed by your surgeon. You will need to avoid strenuous activity, heavy lifting, and pushing or pulling things with your arms for several weeks. Do not lift anything heavier than 10 lb (4.5 kg).  Perform deep breathing exercises and coughing as directed by your surgeon. This helps prevent a lung infection.  Do not drive until your surgeon approves.  Follow all of the dietary instructions provided by your surgeon or dietitian. You will receive specific instructions on the type, size, and timing of meals.      You may need to stay on a liquid diet for some time after the  surgery.  Drink fluids frequently. You should drink 1 oz of fluid as often as you can.  Take vitamin, calcium, and protein supplements as directed by your surgeon.  If you have a drain from the incision area, make sure you:      Keep the area of the drain clean and dry.  Empty the drain and record the amount of fluid daily.  Talk with your surgeon about when you may return to work and about your exercise routine.   Keep all follow-up appointments with your surgeon and dietitian. SEEK MEDICAL CARE IF:  Your pain is not controlled with medicine.  You have a fever.  You have shaking chills.  You notice any redness, skin irritation, swelling, or drainage of fluid (other than light red) in the incision area.  Your drain gets pulled out accidentally.   Your drain contains bright red blood, green fluid, or fluid that has a foul smell. SEEK IMMEDIATE MEDICAL CARE IF:  You have difficulty breathing.  You have severe calf pain. MAKE SURE YOU:  Understand these instructions.  Will watch your condition.  Will get help right away if you are not doing well or get worse. Document Released: 03/18/2009 Document Revised: 01/22/2013 Document Reviewed: 10/04/2012 Exit[employer] Patient Information 2014 [employer].  

## 2013-05-16 NOTE — Progress Notes (Signed)
Jason Berry 47 y.o.  Body mass index is 66.14 kg/(m^2).  Jason Berry comes in today and he has lost 83 pounds since the surgery. He is down 12 pounds since his last visit in September. He is having much more active and he feels great. He is losing weight at a slower but reasonable pace. He has no issues and I will see him again in about 3 months for the end of February.  Patient Active Problem List   Diagnosis Date Noted  . Lap 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 10/18/2012  . Morbid obesity with BMI of 70 and over, adult 08/23/2012    Allergies  Allergen Reactions  . Ibuprofen Other (See Comments)    'Kidney problems"  . Percocet [Oxycodone-Acetaminophen]     itching  . Sulfa Drugs Cross Reactors   . Topamax [Topiramate]     Causes kidney failure    Past Surgical History  Procedure Laterality Date  . Knee surgery      bilateral  . Laparoscopic gastric sleeve resection N/A 09/30/2012    Procedure: LAPAROSCOPIC GASTRIC SLEEVE RESECTION;  Surgeon: Valarie Merino, MD;  Location: [facility];  Service: General;  Laterality: N/A;  Sleeve Gastrectomy  . Esophagogastroduodenoscopy N/A 09/30/2012    Procedure: ESOPHAGOGASTRODUODENOSCOPY (EGD);  Surgeon: Valarie Merino, MD;  Location: [facility];  Service: General;  Laterality: N/A;   Jason Bangs, MD No diagnosis found.  Doing well and feeling much better. Status post sleeve gastrectomy. Matt B. Daphine Deutscher, MD, [facility] [phone] beeper [phone]  05/16/2013 2:45 PM

## 2013-05-17 IMAGING — CR DG CHEST 2V
2 series · 2 of 2 positions shown · non-contrast
Comparison: None

CLINICAL DATA: Preoperative evaluation for gastric sleeve, history
hypertension, gout

CHEST - 2 VIEW

[w chest pa *]
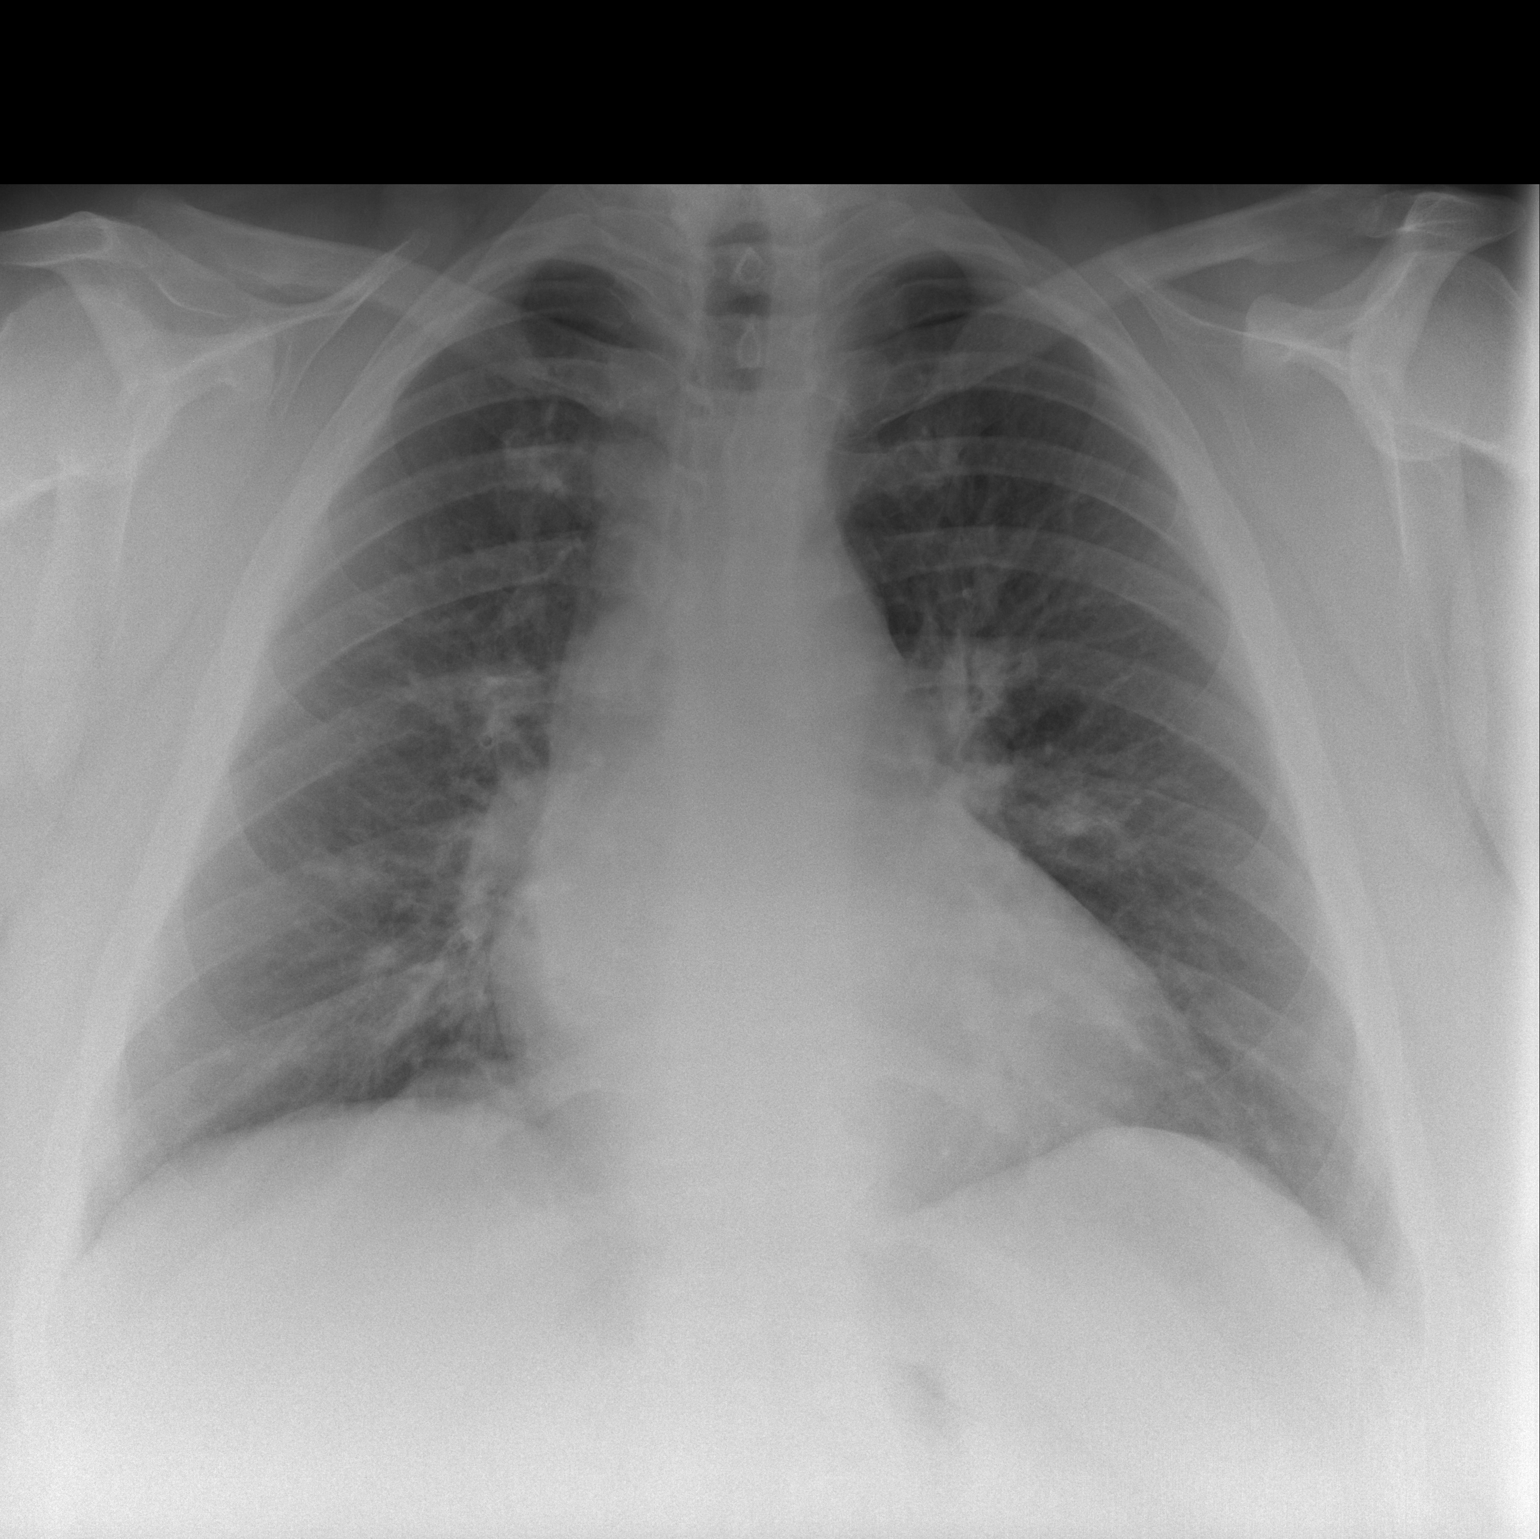

[w chest lat *]
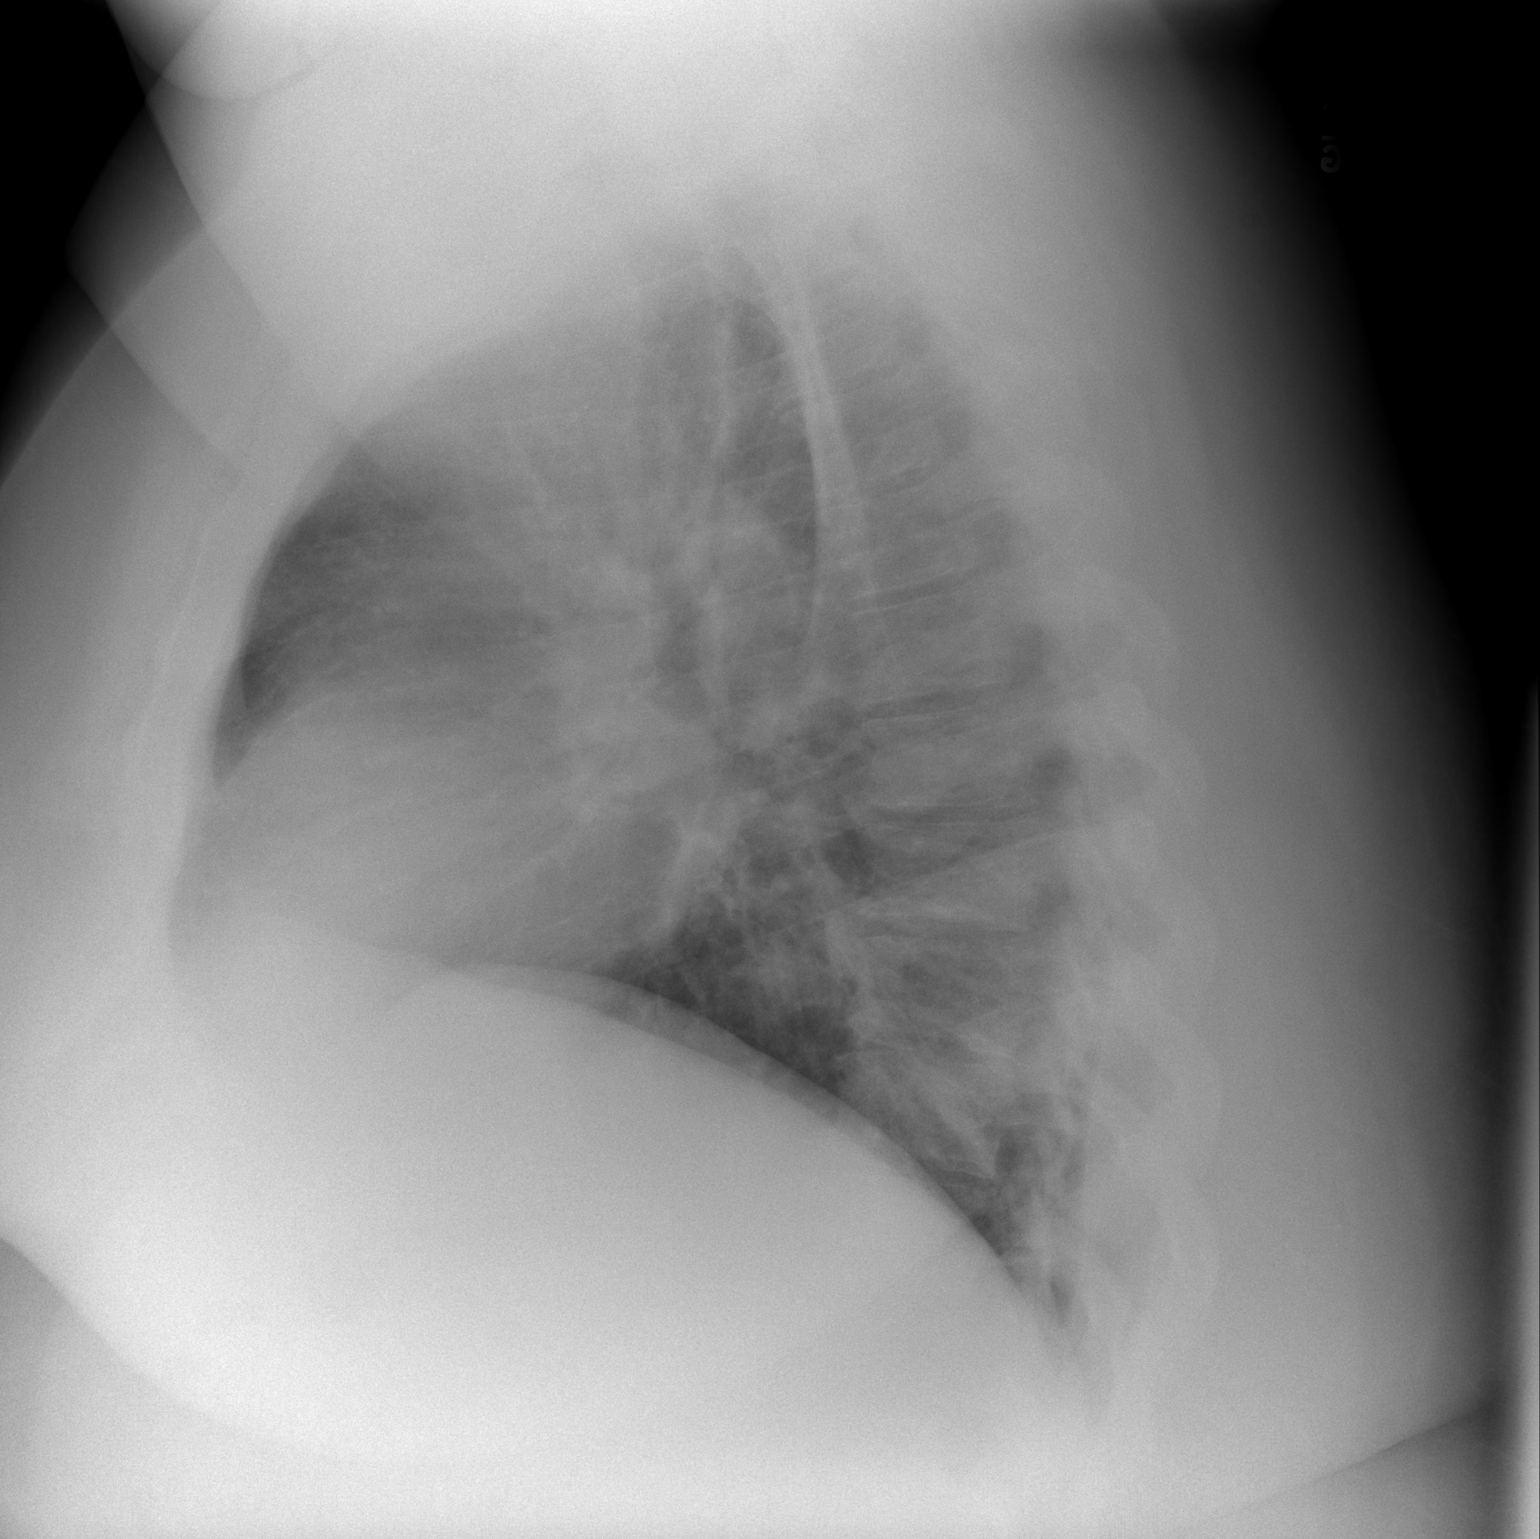

[2 of 2 positions shown; findings below may reference images not displayed]

FINDINGS: Enlargement of cardiac silhouette.
Mediastinal contours and pulmonary vascularity normal.
Peribronchial thickening without infiltrate, pleural effusion or
pneumothorax.
No acute osseous findings.
IMPRESSION: Enlargement of cardiac silhouette.
Mild bronchitic changes.

## 2013-05-20 ENCOUNTER — Telehealth: Payer: Self-pay | Admitting: Dietician

## 2013-05-20 NOTE — Telephone Encounter (Signed)
TANITA BODY COMP RESULTS   04/19/12  05/21/12  06/19/12  07/31/12  09/19/12  10/15/12  01/07/13  05/16/13  BMI (kg/m^2)  75.5  74.8  76.4  76.7  78.1  71.0  67.6  67.0  Fat Mass (lbs)  326.0  319.0  291.5  --  320.5  367.5  317.0  278.0  Fat Free Mass (lbs)  223.0  225.0  264.0  --  247.5  149.0  174.5  209.5  Total Body Water (lbs)  163.0  164.5  193.5  --  181.0  109.0  127.5  153.5  *NOTE: question accuracy of Tanita on 10/15/12.

## 2013-06-16 NOTE — Progress Notes (Signed)
Tanita Scan Attachment

## 2013-06-26 ENCOUNTER — Encounter: Payer: PRIVATE HEALTH INSURANCE | Attending: Surgery | Admitting: Dietician

## 2013-06-26 VITALS — Ht 71.5 in | Wt >= 6400 oz

## 2013-06-26 DIAGNOSIS — Z01818 Encounter for other preprocedural examination: Secondary | ICD-10-CM | POA: Diagnosis not present

## 2013-06-26 DIAGNOSIS — Z713 Dietary counseling and surveillance: Secondary | ICD-10-CM | POA: Insufficient documentation

## 2013-06-26 DIAGNOSIS — Z6841 Body Mass Index (BMI) 40.0 and over, adult: Secondary | ICD-10-CM

## 2013-06-26 NOTE — Patient Instructions (Addendum)
Goals:  Follow Phase 3B: High Protein + Non-Starchy Vegetables  Increase lean protein foods to meet 60-80g goal  Continue drinking 64oz +per day  Add 15 grams of carbohydrate (fruit, whole grain, starchy vegetable) with meals  Avoid drinking 15 minutes before, during and 30 minutes after eating  Aim for >30 min of physical activity daily when possible  Consider trying to take 20 minutes to eat meals and chewing 20-30 bites

## 2013-06-26 NOTE — Progress Notes (Signed)
Follow-up visit:  9 Months Post-Operative Sleeve Gastrectomy Surgery  Medical Nutrition Therapy:  Appt start time: 500   End time:  545.  Primary concerns today: Post-operative Bariatric Surgery Nutrition Management.  Jason Berry returns for f/u with 5 lb weight gain. Eating some carbs (rice, pasta), and alcohol and larger portions. Not exercising much since he is not comfortable exercising outside his home.    Surgery date: 09/30/12  Surgery type: Sleeve  Start weight at [facility]: 581 lbs (04/19/11)  Weight today: 496.5 lb  Weight change: 5.0 lbs  Total weight lost: 84.5 lbs  TANITA BODY COMP RESULTS   04/19/12  05/21/12  06/19/12  07/31/12  09/19/12  10/15/12  01/07/13  05/16/13  06/26/13  BMI (kg/m^2)  75.5  74.8  76.4  76.7  78.1  71.0  67.6  67.0  68.3  Fat Mass (lbs)  326.0  319.0  291.5  --  320.5  367.5  317.0  278.0  276.5  Fat Free Mass (lbs)  223.0  225.0  264.0  --  247.5  149.0  174.5  209.5  220.0  Total Body Water (lbs)  163.0  164.5  193.5  --  181.0  109.0  127.5  153.5  161.0  *NOTE: question accuracy of Tanita on 10/15/12.    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   Progress Towards Goal(s):  In progress.   Nutritional Diagnosis:  [place]-3.3 Overweight/obesity related to past poor dietary habits and physical inactivity as evidenced by patient w/ recent Sleeve Gastrectomy surgery following dietary guidelines for continued weight loss.    Intervention:  Nutrition education  Monitoring/Evaluation:  Dietary intake, exercise, and body  weight. Follow up in 3 months for 12 month post-op visit.

## 2013-09-25 ENCOUNTER — Ambulatory Visit: Payer: PRIVATE HEALTH INSURANCE | Admitting: Dietician

## 2016-05-02 ENCOUNTER — Encounter (HOSPITAL_COMMUNITY): Payer: Self-pay

## 2017-05-01 ENCOUNTER — Encounter (HOSPITAL_COMMUNITY): Payer: Self-pay

## 2021-05-24 ENCOUNTER — Telehealth: Payer: Self-pay

## 2021-05-24 NOTE — Telephone Encounter (Signed)
Spoke with patient's SO Myrene Buddy and scheduled an in-person Palliative Consult for 06/13/21 @ 12:30 PM.   COVID screening was negative. Two dogs in the home will put away. Patient lives with SO and another roommate.   Consent obtained; updated Outlook/Netsmart/Team List and Epic.   Family is aware they may be receiving a call from provider the day before or day of to confirm appointment.

## 2021-06-13 ENCOUNTER — Other Ambulatory Visit: Payer: Self-pay

## 2021-06-13 ENCOUNTER — Other Ambulatory Visit: Payer: Medicare Other | Admitting: Hospice

## 2021-06-30 ENCOUNTER — Other Ambulatory Visit: Payer: Medicare Other | Admitting: Hospice

## 2021-06-30 ENCOUNTER — Other Ambulatory Visit: Payer: Self-pay

## 2021-06-30 DIAGNOSIS — R609 Edema, unspecified: Secondary | ICD-10-CM

## 2021-06-30 DIAGNOSIS — Z515 Encounter for palliative care: Secondary | ICD-10-CM

## 2021-06-30 DIAGNOSIS — R531 Weakness: Secondary | ICD-10-CM

## 2021-06-30 DIAGNOSIS — E1151 Type 2 diabetes mellitus with diabetic peripheral angiopathy without gangrene: Secondary | ICD-10-CM

## 2021-06-30 DIAGNOSIS — M25562 Pain in left knee: Secondary | ICD-10-CM

## 2021-06-30 DIAGNOSIS — G8929 Other chronic pain: Secondary | ICD-10-CM

## 2021-06-30 NOTE — Progress Notes (Signed)
[facility] Consult Note Telephone: [phone]  Fax: [phone]  PATIENT NAME: Jason Berry [place] [address] [phone] (home)  DOB: Dec 19, 1965 MRN: [id number]  PRIMARY CARE PROVIDER:    Dr Ermalinda Barrios PROVIDER:   Jamesetta Geralds, MD [address] [place],  [place] [address] [phone]  RESPONSIBLE PARTY:   Self Gino Garrabrant is patient's spouse [phone] c best number  Contact Information     Name Relation Home Work Mobile   [place] Significant other   [phone]   Loomis, Anacker Father [phone]         I met face to face with patient and family at home. Palliative Care was asked to follow this patient by consultation request of  Radiontchenko, Alexei, * to address advance care planning, complex medical decision making and goals of care clarification. This is the initial visit.    ASSESSMENT AND / RECOMMENDATIONS:   Advance Care Planning: Our advance care planning conversation included a discussion about:    The value and importance of advance care planning  Difference between Hospice and Palliative care Exploration of goals of care in the event of a sudden injury or illness  Identification and preparation of a healthcare agent  Review and updating or creation of an  advance directive document . Decision not to resuscitate or to de-escalate disease focused treatments due to poor prognosis.  CODE STATUS: Ramifications and implications of CODE STATUS discussed.  Patient affirmed he is a DO NOT RESUSCITATE.  NP signed DNR for patient to keep at home.  Goals of Care: Goals include to maximize quality of life and symptom management  Visit consisted of counseling and education dealing with the complex and emotionally intense issues of symptom management and palliative care in the setting of serious and potentially life-threatening illness.  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  Therapeutic listening and ample emotional support provided palliative care team will continue to support patient, patient's family, and medical team.  I spent  46 minutes providing this initial consultation. More than 50% of the time in this consultation was spent on counseling patient and coordinating communication. --------------------------------------------------------------------------------------------------------------------------------------  Symptom Management/Plan: Weakness: Physical therapy is ongoing for strengthening and ambulation Morbid Obesity:Current weight 550 Ibs. Education provided on avoiding empty caloric beverages, sweetened drinks.  Encouraged [employer]. Recommendation: Nutritional consult will help patient.  Bil knee pain: Managed with Tylenol, Exedrine, and prn Vicodine. [facility] patient's pain.  Type 2 DM: Managed with Gliperide. Last Aic 7.2 one year ago per patient. Repeat A1c, CBC BMP Thyroid panel with TSH. Edema: Bilateral lower extremities.  Continue Unna boots and elevation of extremities.  Continue Lasix as ordered.  Follow up: Palliative care will continue to follow for complex medical decision making, advance care planning, and clarification of goals. Return 6 weeks or prn. Encouraged to call provider sooner with any concerns.   Family /Caregiver/Community Supports: Patient lives at home with significant other. [facility] weekly for Unna booth dressing changes,   HOSPICE ELIGIBILITY/DIAGNOSIS: TBD  Chief Complaint: Initial Palliative care visit  HISTORY OF PRESENT ILLNESS:  [name] is a 56 y.o. year old male  with multiple medical conditions including morbid obesity, bilateral knee pain, edema to bilateral lower extremities, weakness.  History of bil knee surgery for meniscus and ACL repair, lap sleeve  gastrectomy, Type  2 DM, HTN.  Patient denied pain/discomfort, in no respiratory distress. History obtained from review of EMR, discussion with primary team, caregiver, family and/or Mr. Creary.  Review and summarization of Epic records shows history from other than patient. Rest of 10 point ROS asked and negative.  I reviewed as needed, available labs, patient records, imaging, studies and related documents from the EMR. ROS General: NAD EYES: denies vision changes ENMT: denies dysphagia Cardiovascular: denies chest pain/discomfort Pulmonary: denies cough, denies SOB Abdomen: endorses good appetite, denies constipation/diarrhea GU: denies dysuria, urinary frequency MSK:  endorses weakness,  no falls reported Skin: denies rashes or wounds Neurological: denies pain, denies insomnia Psych: Endorses positive mood Heme/lymph/immuno: denies bruises, abnormal bleeding  Physical Exam: Height/Weight: 5 feet 10 inches/550 Ibs Constitutional: NAD General: Well groomed, cooperative EYES: anicteric sclera, lids intact, no discharge  ENMT: Moist mucous membrane CV: S1 S2, RRR, no LE edema Pulmonary: LCTA, no increased work of breathing, no cough, Abdomen: active BS + 4 quadrants, soft and non tender GU: no suprapubic tenderness MSK: weakness, sarcopenia, limited ROM Skin: warm and dry, no rashes or wounds on visible skin Neuro:  weakness, otherwise non focal Psych: non-anxious affect Hem/lymph/immuno: no widespread bruising   PAST MEDICAL HISTORY:  Active Ambulatory Problems    Diagnosis Date Noted   Morbid obesity with BMI of 70 and over, adult ([facility]) 08/23/2012   Lap 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 10/18/2012   Resolved Ambulatory Problems    Diagnosis Date Noted   No Resolved Ambulatory Problems   Past Medical History:  Diagnosis Date   Arthritis    Cellulitis    Depression    Gout    Hypertension    Morbid obesity ([facility])    Prediabetes    Sleep apnea     SOCIAL HX:  Social History    Tobacco Use   Smoking status: Never   Smokeless tobacco: Never  Substance Use Topics   Alcohol use: Yes    Alcohol/week: 2.0 standard drinks    Types: 2 Shots of liquor per week     FAMILY HX:  Family History  Problem Relation Age of Onset   Hypertension Mother    Diabetes Mother    Hypertension Father       ALLERGIES:  Allergies  Allergen Reactions   Ibuprofen Other (See Comments)    'Kidney problems"   Percocet [Oxycodone-Acetaminophen]     itching   Sulfa Drugs Cross Reactors    Topamax [Topiramate]     Causes kidney failure      PERTINENT MEDICATIONS:  Outpatient Encounter Medications as of 06/30/2021  Medication Sig   allopurinol (ZYLOPRIM) 300 MG tablet Take 300 mg by mouth daily.    cetirizine (ZYRTEC) 10 MG tablet Take 10 mg by mouth 2 (two) times daily.    Cholecalciferol (VITAMIN D PO) Take by mouth.   ECHINACEA PO Take 1 tablet by mouth as directed. Takes for 21 days and stops for 21 days( not taking at this time)   fish oil-omega-3 fatty acids 1000 MG capsule Take 1 g by mouth daily.   furosemide (LASIX) 20 MG tablet Take 20 mg by mouth 2 (two) times daily.    HYDROcodone-acetaminophen (VICODIN) 5-500 MG per tablet Take 1 tablet by mouth every 6 (six) hours as needed for pain.    lisinopril (PRINIVIL,ZESTRIL) 40 MG tablet Take 40 mg by mouth 2 (two) times daily.    lovastatin (MEVACOR) 20 MG tablet Take 20 mg by mouth at  bedtime.   metFORMIN (GLUCOPHAGE) 500 MG tablet Take 500 mg by mouth daily with breakfast.   Multiple Vitamin (MULTIVITAMIN WITH MINERALS) TABS Take 1 tablet by mouth daily.   ST JOHNS WORT EXTRACT PO Take by mouth.   vitamin A 10000 UNIT capsule Take 10,000 Units by mouth daily.   vitamin C (ASCORBIC ACID) 500 MG tablet Take 500 mg by mouth daily.   vitamin E 400 UNIT capsule Take 400 Units by mouth daily.   No facility-administered encounter medications on file as of 06/30/2021.     Thank you for the opportunity to participate in  the care of Mr. Jowett.  The palliative care team will continue to follow. Please call our office at [phone] if we can be of additional assistance.   Note: Portions of this note were generated with Lobbyist. Dictation errors may occur despite best attempts at proofreading.  Teodoro Spray, NP

## 2021-08-04 ENCOUNTER — Other Ambulatory Visit: Payer: Medicare Other | Admitting: Hospice

## 2021-08-04 ENCOUNTER — Other Ambulatory Visit: Payer: Self-pay

## 2021-08-04 DIAGNOSIS — R609 Edema, unspecified: Secondary | ICD-10-CM

## 2021-08-04 DIAGNOSIS — Z6841 Body Mass Index (BMI) 40.0 and over, adult: Secondary | ICD-10-CM

## 2021-08-04 DIAGNOSIS — Z515 Encounter for palliative care: Secondary | ICD-10-CM

## 2021-08-04 DIAGNOSIS — G8929 Other chronic pain: Secondary | ICD-10-CM

## 2021-08-04 DIAGNOSIS — R531 Weakness: Secondary | ICD-10-CM

## 2021-08-04 DIAGNOSIS — M25562 Pain in left knee: Secondary | ICD-10-CM

## 2021-08-04 DIAGNOSIS — E1151 Type 2 diabetes mellitus with diabetic peripheral angiopathy without gangrene: Secondary | ICD-10-CM

## 2021-08-04 NOTE — Progress Notes (Addendum)
? ? ?Manufacturing engineer ?Community Palliative [facility] Consult Note ?Telephone: [phone]  ?Fax: [phone] ? ?PATIENT NAME: Jason Berry ?[address] ?[place] [address] ?[phone] (home)  ?DOB: 1966-04-30 ?MRN: [id number] ? ?PRIMARY CARE PROVIDER:    ?Dr Daphene Jaeger ? ?REFERRING PROVIDER:   ?Jamesetta Geralds, MD ?[address] ?[place],  [place] [address] ?[phone] ? ?RESPONSIBLE PARTY:   Self ?Creed Copper Fess is patient's spouse [phone] c best number  ?Contact Information   ? ? Name Relation Home Work Mobile  ? Thickpen,Yvonne Significant other   [phone]  ? Leasure Sr,Jailin Father [phone]    ? ?  ? ? ?I met face to face with patient and family at home. Palliative Care was asked to follow this patient by consultation request of  Radiontchenko, Alexei, * to address advance care planning, complex medical decision making and goals of care clarification. This is a f/u visit. ? ?  ASSESSMENT AND / RECOMMENDATIONS:  ? ?CODE STATUS: Patient affirmed he is a DO NOT RESUSCITATE.   ? ?Goals of Care: Goals include to maximize quality of life and symptom management ? ?Visit consisted of counseling and education dealing with the complex and emotionally intense issues of symptom management and palliative care in the setting of serious and potentially life-threatening illness.  ?Palliative care team will continue to support patient, patient's family, and medical team. ? ?Symptom Management/Plan: ?Weakness: Patient recently completed physical therapy.  Continue physical activity and promote independence in ADLs as able.  Balance of rest and performance activity. ?Morbid Obesity:Current weight 550 Ibs. Education provided on avoiding empty caloric beverages, sweetened drinks.  He reports taking only 3 carbonated beverages in the last 6 weeks.  Validation provided; encouraged Dash diet. He reports he is taking premier protein drink.  ?Recommendation: Nutritional consult will help  patient.  ?Bil knee pain: [facility]. Currently managed with  Tylenol, Exedrine, and prn Vicodine.  Continue with follow up appointment as planned. ?Type 2 DM: Managed with Gliperide 3m daily. Last A1c 7.2 one year ago per patient.  Patient reports he will be having in person home visit with PCP tomorrow for exam and labs.  Recommendation: ?Repeat A1c, CBC BMP Thyroid panel with TSH. ?Edema: Bilateral lower extremities.  Continue Unna boots and elevation of extremities.  Continue Lasix as ordered. ? ?Follow up: Palliative care will continue to follow for complex medical decision making, advance care planning, and clarification of goals. Return 6 weeks or prn. Encouraged to call provider sooner with any concerns.  ? ?Family /Caregiver/Community Supports: Patient lives at home with significant other. [facility] weekly for Unna booth dressing changes.  Patient is an aOccupational hygienistand loves bikes and trucks.   ? ?HOSPICE ELIGIBILITY/DIAGNOSIS: TBD ? ?Chief Complaint: Follow-up visit ? ?HISTORY OF PRESENT ILLNESS:  Jason Berry is a 56y.o. year old male  with multiple medical conditions including morbid obesity, bilateral knee pain, edema to bilateral lower extremities, weakness.  ?History of bil knee surgery for meniscus and ACL repair, lap sleeve gastrectomy, Type 2 DM, HTN.  Patient denied pain/discomfort, in no respiratory distress. ?History obtained from review of EMR, discussion with primary team, caregiver, family and/or Mr. Menge.  ?Review and summarization of Epic records shows history from other than patient. Rest of 10 point ROS asked and negative.  ?I reviewed as needed, available labs, patient records, imaging, studies and related documents from the EMR. ? ?ROS ?General: NAD ?  EYES: denies vision changes ?ENMT: denies dysphagia ?Cardiovascular: denies chest pain/discomfort ?Pulmonary: denies cough, denies SOB ?Abdomen: endorses good appetite,  denies constipation/diarrhea ?GU: denies dysuria, urinary frequency ?MSK:  endorses weakness,  no falls reported ?Skin: denies rashes or wounds ?Neurological: denies pain, denies insomnia ?Psych: Endorses positive mood ?Heme/lymph/immuno: denies bruises, abnormal bleeding ? ? ?PAST MEDICAL HISTORY:  ?Active Ambulatory Problems  ?  Diagnosis Date Noted  ? Morbid obesity with BMI of 70 and over, adult ([facility]) 08/23/2012  ? 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 ?Resolved Ambulatory Problems  ?  Diagnosis Date Noted  ? No Resolved Ambulatory Problems  ? ?Past Medical History:  ?Diagnosis Date  ? Arthritis   ? Cellulitis   ? Depression   ? Gout   ? Hypertension   ? Morbid obesity ([facility])   ? Prediabetes   ? Sleep apnea   ? ? ?SOCIAL HX:  ?Social History  ? ?Tobacco Use  ? Smoking status: Never  ? Smokeless tobacco: Never  ?Substance Use Topics  ? Alcohol use: Yes  ?  Alcohol/week: 2.0 standard drinks  ?  Types: 2 Shots of liquor per week  ? ?  ?FAMILY HX:  ?Family History  ?Problem Relation Age of Onset  ? Hypertension Mother   ? Diabetes Mother   ? Hypertension Father   ?   ? ?ALLERGIES:  ?Allergies  ?Allergen Reactions  ? Ibuprofen Other (See Comments)  ?  'Kidney problems"  ? Percocet [Oxycodone-Acetaminophen]   ?  itching  ? Sulfa Drugs Cross Reactors   ? Topamax [Topiramate]   ?  Causes kidney failure  ?   ? ?PERTINENT MEDICATIONS:  ?Outpatient Encounter Medications as of 08/04/2021  ?Medication Sig  ? allopurinol (ZYLOPRIM) 300 MG tablet Take 300 mg by mouth daily.   ? cetirizine (ZYRTEC) 10 MG tablet Take 10 mg by mouth 2 (two) times daily.   ? Cholecalciferol (VITAMIN D PO) Take by mouth.  ? ECHINACEA PO Take 1 tablet by mouth as directed. Takes for 21 days and stops for 21 days( not taking at this time)  ? fish oil-omega-3 fatty acids 1000 MG capsule Take 1 g by mouth daily.  ? furosemide (LASIX) 20 MG tablet Take 20 mg by mouth 2 (two) times daily.   ? HYDROcodone-acetaminophen (VICODIN) 5-500 MG per tablet  Take 1 tablet by mouth every 6 (six) hours as needed for pain.   ? lisinopril (PRINIVIL,ZESTRIL) 40 MG tablet Take 40 mg by mouth 2 (two) times daily.   ? lovastatin (MEVACOR) 20 MG tablet Take 20 mg by mouth at bedtime.  ? metFORMIN (GLUCOPHAGE) 500 MG tablet Take 500 mg by mouth daily with breakfast.  ? Multiple Vitamin (MULTIVITAMIN WITH MINERALS) TABS Take 1 tablet by mouth daily.  ? ST JOHNS WORT EXTRACT PO Take by mouth.  ? vitamin A 10000 UNIT capsule Take 10,000 Units by mouth daily.  ? vitamin C (ASCORBIC ACID) 500 MG tablet Take 500 mg by mouth daily.  ? vitamin E 400 UNIT capsule Take 400 Units by mouth daily.  ? ?No facility-administered encounter medications on file as of 08/04/2021.  ? ?I spent 45 minutes providing this consultation; time includes spent with patient/family, chart review and documentation. More than 50% of the time in this consultation was spent on care coordination ? ?Thank you for the opportunity to participate in the care of Mr. Bevins.  The palliative care team will continue to follow. Please call our office at [phone] if we  can be of additional assistance.  ? ?Note: Portions of this note were generated with Lobbyist. Dictation errors may occur despite best attempts at proofreading. ? ?Teodoro Spray, NP  ? ?  ?

## 2021-09-29 ENCOUNTER — Other Ambulatory Visit: Payer: Medicare Other | Admitting: Hospice

## 2021-09-29 DIAGNOSIS — R609 Edema, unspecified: Secondary | ICD-10-CM

## 2021-09-29 DIAGNOSIS — Z515 Encounter for palliative care: Secondary | ICD-10-CM

## 2021-09-29 DIAGNOSIS — R531 Weakness: Secondary | ICD-10-CM

## 2021-09-29 DIAGNOSIS — E1151 Type 2 diabetes mellitus with diabetic peripheral angiopathy without gangrene: Secondary | ICD-10-CM

## 2021-09-29 DIAGNOSIS — G8929 Other chronic pain: Secondary | ICD-10-CM

## 2021-09-29 NOTE — Progress Notes (Signed)
? ? ?Civil engineer, contracting ?Community Palliative Care Consult Note ?Telephone: [phone]  ?Fax: [phone] ? ?PATIENT NAME: Jason Berry ?[address] ?[place] [address] ?[phone] (home)  ?DOB: Oct 06, 1965 ?MRN: [id number] ? ?PRIMARY CARE PROVIDER:    ?Dr Jason Berry ? ?REFERRING PROVIDER:   ?Jason Bangs, MD ?[address] ?[place],  [place] [address] ?[phone] ? ?RESPONSIBLE PARTY:   Self ?Jason Berry is patient's spouse [phone] c best number  ?Contact Information   ? ? Name Relation Home Work Mobile  ? Berry,Jason Significant other   [phone]  ? [place]urs[place] Berry,Jason Father [phone]    ? ?  ? ?TELEHEALTH VISIT STATEMENT ?Due to the COVID-19 crisis, this visit was done via telemedicine from my office and it was initiated and consent by this patient and or family.  ?I connected with patient OR PROXY by a telephone/video  and verified that I am speaking with the correct person. I discussed the limitations of evaluation and management by telemedicine. Patient/proxy expressed understanding and agreed to proceed. ?Palliative Care was asked to follow this patient to address advance care planning, complex medical decision making and goals of care clarification.   ? This is a f/u visit. ? ?  ASSESSMENT AND / RECOMMENDATIONS:  ? ?CODE STATUS: Patient affirmed he is a DO NOT RESUSCITATE.   ? ?Goals of Care: Goals include to maximize quality of life and symptom management ? ?Visit consisted of counseling and education dealing with the complex and emotionally intense issues of symptom management and palliative care in the setting of serious and potentially life-threatening illness.  ?Palliative care team will continue to support patient, patient's family, and medical team. ? ?Symptom Management/Plan: ?Morbid Obesity:Current weight 550 Ibs, Height 5 feet 11 inches, BMI 76.70. PCP saw patient last week and discussed weight loss. Patient reports his insurance will not cover  for nutritional consult. PCP sent in prescription for Mounjaro to help with his sugar control and also weight loss Education provided on avoiding empty caloric beverages, sweetened drinks, encouraged Dash diet.Nutritional consult is recommended.  ?Weakness: Patient recently completed physical therapy.  Continue physical activity and promote independence in ADLs as able.  Balance of rest and performance activity. ?Bil knee pain: [facility],  last visit was last 2 weeks ago. Currently managed with  Tylenol, Exedrine, and prn Vicodine.  Continue with follow up appointment as planned. ?Type 2 DM: Managed with Gliperide 2mg  daily. Current A1c  8.2. Use Mounjaro ( Tirzepatide ) injection as ordered. No concentrated sweet. Continue diabetic diet.  ?Repeat A1c in 3 months, CBC BMP Thyroid panel with TSH. ?Edema: Bilateral lower extremities.  Continue  elevation of extremities.  Continue Lasix as ordered.Adhere to salt and fluid limits.  ? ?Follow up: Palliative care will continue to follow for complex medical decision making, advance care planning, and clarification of goals. Return 6 weeks or prn. Encouraged to call provider sooner with any concerns.  ? ?Family /Caregiver/Community Supports: Patient lives at home with significant other. .  Patient is an and loves bikes and trucks.   ? ?HOSPICE ELIGIBILITY/DIAGNOSIS: TBD ? ?Chief Complaint: Follow-up visit ? ?HISTORY OF PRESENT ILLNESS:  Jason Berry is a 56 y.o. year old male  with multiple medical conditions including morbid obesity, bilateral knee pain, edema to bilateral lower extremities, weakness.  ?History of bil knee surgery for meniscus and ACL repair, lap sleeve gastrectomy, Type 2 DM, HTN.  Patient denied pain/discomfort, in no respiratory  distress, endorses weakness and mobility limitation. ?History obtained from review of EMR, discussion with primary team, caregiver, family and/or Jason Berry.  ?Review and summarization  of Epic records shows history from other than patient. Rest of 10 point ROS asked and negative.  ?I reviewed as needed, available labs, patient records, imaging, studies and related documents from the EMR. ? ?ROS ?General: NAD ?EYES: denies vision changes ?ENMT: denies dysphagia ?Cardiovascular: denies chest pain/discomfort ?Pulmonary: denies cough, denies SOB ?Abdomen: endorses good appetite, denies constipation/diarrhea ?GU: denies dysuria, urinary frequency ?MSK:  endorses weakness, limited ROM  no falls reported ?Skin: denies rashes or wounds ?Neurological: occasional pain managed by pain clinic, denies insomnia ?Psych: Endorses positive mood ?Heme/lymph/immuno: denies bruises, abnormal bleeding ? ? ?PAST MEDICAL HISTORY:  ?Active Ambulatory Problems  ?  Diagnosis Date Noted  ? Morbid obesity with BMI of 70 and over, adult ([facility]) 08/23/2012  ? 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 ?Resolved Ambulatory Problems  ?  Diagnosis Date Noted  ? No Resolved Ambulatory Problems  ? ?Past Medical History:  ?Diagnosis Date  ? Arthritis   ? Cellulitis   ? Depression   ? Gout   ? Hypertension   ? Morbid obesity ([facility])   ? Prediabetes   ? Sleep apnea   ? ? ?SOCIAL HX:  ?Social History  ? ?Tobacco Use  ? Smoking status: Never  ? Smokeless tobacco: Never  ?Substance Use Topics  ? Alcohol use: Yes  ?  Alcohol/week: 2.0 standard drinks  ?  Types: 2 Shots of liquor per week  ? ?  ?FAMILY HX:  ?Family History  ?Problem Relation Age of Onset  ? Hypertension Mother   ? Diabetes Mother   ? Hypertension Father   ?   ? ?ALLERGIES:  ?Allergies  ?Allergen Reactions  ? Ibuprofen Other (See Comments)  ?  'Kidney problems"  ? Percocet [Oxycodone-Acetaminophen]   ?  itching  ? Sulfa Drugs Cross Reactors   ? Topamax [Topiramate]   ?  Causes kidney failure  ?   ? ?PERTINENT MEDICATIONS:  ?Outpatient Encounter Medications as of 54/27/2023  ?Medication Sig  ? allopurinol (ZYLOPRIM) 300 MG tablet Take 300 mg by mouth daily.   ? cetirizine  (ZYRTEC) 10 MG tablet Take 10 mg by mouth 2 (two) times daily.   ? Cholecalciferol (VITAMIN D PO) Take by mouth.  ? ECHINACEA PO Take 1 tablet by mouth as directed. Takes for 21 days and stops for 21 days( not taking at this time)  ? fish oil-omega-3 fatty acids 1000 MG capsule Take 1 g by mouth daily.  ? furosemide (LASIX) 20 MG tablet Take 20 mg by mouth 2 (two) times daily.   ? HYDROcodone-acetaminophen (VICODIN) 5-500 MG per tablet Take 1 tablet by mouth every 6 (six) hours as needed for pain.   ? lisinopril (PRINIVIL,ZESTRIL) 40 MG tablet Take 40 mg by mouth 2 (two) times daily.   ? lovastatin (MEVACOR) 20 MG tablet Take 20 mg by mouth at bedtime.  ? metFORMIN (GLUCOPHAGE) 500 MG tablet Take 500 mg by mouth daily with breakfast.  ? Multiple Vitamin (MULTIVITAMIN WITH MINERALS) TABS Take 1 tablet by mouth daily.  ? ST JOHNS WORT EXTRACT PO Take by mouth.  ? vitamin A 64332 UNIT capsule Take 10,000 Units by mouth daily.  ? vitamin C (ASCORBIC ACID) 500 MG tablet Take 500 mg by mouth daily.  ? vitamin E 400 UNIT capsule Take 400 Units by mouth daily.  ? ?No facility-administered encounter  medications on file as of 54/27/2023.  ? ?I spent 45 minutes providing this consultation; time includes spent with patient/family, chart review and documentation. More than 50% of the time in this consultation was spent on care coordination ? ?Thank you for the opportunity to participate in the care of Jason Berry.  The palliative care team will continue to follow. Please call our office at [phone] if we can be of additional assistance.  ? ?Note: Portions of this note were generated with Scientist, clinical (histocompatibility and immunogenetics)Dragon dictation software. Dictation errors may occur despite best attempts at proofreading. ? ?Rosaura CarpenterLivina I Icesis Renn, NP  ? ?  ?

## 2021-12-26 ENCOUNTER — Other Ambulatory Visit: Payer: Medicare Other | Admitting: Hospice

## 2021-12-26 DIAGNOSIS — Z515 Encounter for palliative care: Secondary | ICD-10-CM

## 2021-12-26 NOTE — Progress Notes (Signed)
NP called today at 1230 pm for scheduled tele visit. Spouse reports patient not able to come to the phone because he was sleeping. She will call later to reschedule.

## 2022-04-11 ENCOUNTER — Telehealth: Payer: Self-pay

## 2022-04-11 NOTE — Telephone Encounter (Signed)
(  9:08 am) PC SW completed a call with patient to discuss his status. He reported that outside of having a rash on the back of his leg, he has been doing fine. SW explained to him that palliative care will move him to inactive status as his condition appears relatively stable. SW encouraged him to call back with change in condition/decline. Patient verbalized understanding.  Palliative care will sign off of patient's care effective today, 04/11/2022.
